# Patient Record
Sex: Female | Born: 2000 | Hispanic: Yes | Marital: Single | Smoking: Never smoker
Health system: Southern US, Community
[De-identification: ages and names within clinical notes are randomized; demographics above are authoritative.]

## PROBLEM LIST (undated history)

## (undated) DIAGNOSIS — Z7289 Other problems related to lifestyle: Secondary | ICD-10-CM

## (undated) DIAGNOSIS — F410 Panic disorder [episodic paroxysmal anxiety] without agoraphobia: Secondary | ICD-10-CM

## (undated) DIAGNOSIS — F419 Anxiety disorder, unspecified: Secondary | ICD-10-CM

## (undated) HISTORY — DX: Anxiety disorder, unspecified: F41.9

## (undated) HISTORY — DX: Other problems related to lifestyle: Z72.89

## (undated) HISTORY — PX: EYE SURGERY: SHX253

## (undated) HISTORY — DX: Panic disorder (episodic paroxysmal anxiety): F41.0

---
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
## 2020-01-03 DIAGNOSIS — H169 Unspecified keratitis: Secondary | ICD-10-CM | POA: Diagnosis not present

## 2020-01-03 DIAGNOSIS — H538 Other visual disturbances: Secondary | ICD-10-CM | POA: Diagnosis not present

## 2020-01-11 ENCOUNTER — Ambulatory Visit (INDEPENDENT_AMBULATORY_CARE_PROVIDER_SITE_OTHER): Payer: Medicaid Other | Admitting: Pediatrics

## 2020-01-11 ENCOUNTER — Encounter: Payer: Self-pay | Admitting: Pediatrics

## 2020-01-11 ENCOUNTER — Telehealth: Payer: Self-pay | Admitting: Pediatrics

## 2020-01-11 ENCOUNTER — Other Ambulatory Visit: Payer: Self-pay

## 2020-01-11 VITALS — Temp 98.5°F | Wt 127.6 lb

## 2020-01-11 DIAGNOSIS — N3000 Acute cystitis without hematuria: Secondary | ICD-10-CM | POA: Diagnosis not present

## 2020-01-11 DIAGNOSIS — H5213 Myopia, bilateral: Secondary | ICD-10-CM | POA: Diagnosis not present

## 2020-01-11 LAB — POCT URINALYSIS DIPSTICK
Bilirubin, UA: NEGATIVE
Glucose, UA: NEGATIVE
Ketones, UA: NEGATIVE
Nitrite, UA: NEGATIVE
Protein, UA: NEGATIVE
Spec Grav, UA: 1.01 (ref 1.010–1.025)
Urobilinogen, UA: NEGATIVE E.U./dL — AB
pH, UA: 8 (ref 5.0–8.0)

## 2020-01-11 MED ORDER — NITROFURANTOIN MONOHYD MACRO 100 MG PO CAPS: 100.0000 mg | ORAL_CAPSULE | Freq: Two times a day (BID) | ORAL | 0 refills | Status: AC

## 2020-01-11 NOTE — Telephone Encounter (Signed)

## 2020-01-11 NOTE — Patient Instructions (Signed)

## 2020-01-11 NOTE — Progress Notes (Signed)
  Subjective:    Kathy Palmer is a 19 y.o. old female here  for Urinary Tract Infection (4 days ago) .    HPI Hurting to pee and different smell for the past 3-4 days.  Pain and lower abdominal pressure today at work.  + urinary frequency and urgency.  No nausea, vomiting, diarrhea, constipation, fever, fatigue, or chills.  No back pain.   No pain with intercourse.  Last intercourse was about 2 weeks ago - 1 female partner.  No new partners.   LMP: last year - has Nexplanon  Review of Systems  History and Problem List: Kathy Palmer has Deliberate self-cutting; Family disruption due to divorce; Anxiety; Birth control counseling; Insomnia; Acne vulgaris; Irregular bleeding; and Abnormal vision screen on their problem list.  Kathy Palmer  has a past medical history of Anxiety, Deliberate self-cutting, and Panic attack (01/09/2015).  Immunizations needed: none     Objective:    Temp 98.5 F (36.9 C) (Temporal)   Wt 127 lb 9.6 oz (57.9 kg)   BMI 26.28 kg/m  Physical Exam Vitals reviewed.  Constitutional:      General: She is not in acute distress.    Appearance: She is not toxic-appearing.  Cardiovascular:     Rate and Rhythm: Normal rate and regular rhythm.     Pulses: Normal pulses.     Heart sounds: Normal heart sounds.  Pulmonary:     Effort: Pulmonary effort is normal.     Breath sounds: Normal breath sounds.  Abdominal:     General: Abdomen is flat. Bowel sounds are normal. There is no distension.     Palpations: Abdomen is soft. There is no mass.     Tenderness: There is no abdominal tenderness. There is no right CVA tenderness or left CVA tenderness.  Genitourinary:    Comments: GU exam deferred Skin:    Capillary Refill: Capillary refill takes less than 2 seconds.  Neurological:     General: No focal deficit present.     Mental Status: She is alert and oriented to person, place, and time.       Assessment and Plan:   Kathy Palmer is a 20 y.o. old female with  Acute cystitis without  hematuria Patient with 4 day history of dysuria, new urinary odor, frequency, and urgency.  U/A with +LE and trace blood consistent with UTI.  Rx for Macrobid.  Culture sent.  Will adjust Rx based on sesitivities if needed.  - POCT urinalysis dipstick - nitrofurantoin, macrocrystal-monohydrate, (MACROBID) 100 MG capsule; Take 1 capsule (100 mg total) by mouth 2 (two) times daily for 5 days.  Dispense: 10 capsule; Refill: 0 - Urine Culture    No follow-ups on file.  Clifton Custard, MD

## 2020-01-11 NOTE — Telephone Encounter (Deleted)
Pre-screening for onsite visit  1. Who is bringing the patient to the visit? ***Pre-screening for onsite visit  1. Who is bringing the patient to the visit? Mom  Informed only one adult can bring patient to the visit to limit possible exposure to COVID19 and facemasks must be worn while in the building by the patient (ages 2 and older) and adult.  2. Has the person bringing the patient or the patient been around anyone with suspected or confirmed COVID-19 in the last 14 days? No   3. Has the person bringing the patient or the patient been around anyone who has been tested for COVID-19 in the last 14 days? No  4. Has the person bringing the patient or the patient had any of these symptoms in the last 14 days? No  Fever (temp 100 F or higher) Breathing problems Cough Sore throat Body aches Chills Vomiting Diarrhea   If all answers are negative, advise patient to call our office prior to your appointment if you or the patient develop any of the symptoms listed above.   If any answers are yes, cancel in-office visit and schedule the patient for a same day telehealth visit with a provider to discuss the next steps.   Informed only one adult can bring patient to the visit to limit possible exposure to COVID19 and facemasks must be worn while in the building by the patient (ages 2 and older) and adult.  2. Has the person bringing the patient or the patient been around anyone with suspected or confirmed COVID-19 in the last 14 days? {yes***/no:17258}   3. Has the person bringing the patient or the patient been around anyone who has been tested for COVID-19 in the last 14 days? {yes***/no:17258}  4. Has the person bringing the patient or the patient had any of these symptoms in the last 14 days? {yes***/no:17258}   Fever (temp 100 F or higher) Breathing problems Cough Sore throat Body aches Chills Vomiting Diarrhea   If all answers are negative, advise patient to call our office  prior to your appointment if you or the patient develop any of the symptoms listed above.   If any answers are yes, cancel in-office visit and schedule the patient for a same day telehealth visit with a provider to discuss the next steps.

## 2020-01-13 LAB — URINE CULTURE
MICRO NUMBER:: 10179283
SPECIMEN QUALITY:: ADEQUATE

## 2020-04-25 ENCOUNTER — Other Ambulatory Visit: Payer: Self-pay

## 2020-04-25 ENCOUNTER — Ambulatory Visit (INDEPENDENT_AMBULATORY_CARE_PROVIDER_SITE_OTHER): Payer: Medicaid Other | Admitting: Pediatrics

## 2020-04-25 ENCOUNTER — Encounter: Payer: Self-pay | Admitting: Pediatrics

## 2020-04-25 ENCOUNTER — Telehealth: Payer: Self-pay | Admitting: Pediatrics

## 2020-04-25 VITALS — Temp 97.8°F | Wt 127.8 lb

## 2020-04-25 DIAGNOSIS — N3000 Acute cystitis without hematuria: Secondary | ICD-10-CM

## 2020-04-25 LAB — POCT URINALYSIS DIPSTICK
Bilirubin, UA: NEGATIVE
Glucose, UA: NEGATIVE
Ketones, UA: NEGATIVE
Nitrite, UA: POSITIVE
Protein, UA: NEGATIVE
Spec Grav, UA: 1.015 (ref 1.010–1.025)
Urobilinogen, UA: 0.2 E.U./dL
pH, UA: 5 (ref 5.0–8.0)

## 2020-04-25 MED ORDER — NITROFURANTOIN MONOHYD MACRO 100 MG PO CAPS: 100.0000 mg | ORAL_CAPSULE | Freq: Two times a day (BID) | ORAL | 0 refills | Status: DC

## 2020-04-25 NOTE — Progress Notes (Signed)
History was provided by the patient.  Kathy Palmer is a 19 y.o. female who is here for urinary tract infection symptoms.     HPI:   - Sx: pain with urination, odorous, cloudy, no urinary incontinence, some normal vaginal discharge, no fevers or body aches, no lower back pain - Sxs started 3 weeks ago. Was treating with OTC UTI pills - which was helping. Sxs came back.  - Here today because still having symptoms - OK appetite - Last sexual intercourse 1 month ago. No pain with sex.  - Birth control: Nexplanon inserted January 2020   Physical Exam:  Temp 97.8 F (36.6 C) (Temporal)   Wt 127 lb 12.8 oz (58 kg)   BMI 26.32 kg/m   Blood pressure percentiles are not available for patients who are 18 years or older.  No LMP recorded. Patient has had an implant.    General:   alert, cooperative and appears stated age     Skin:   normal  Lungs:  clear to auscultation bilaterally  Heart:   regular rate and rhythm, S1, S2 normal, no murmur, click, rub or gallop   Abdomen:  soft, non-tender; bowel sounds normal; no masses,  no organomegaly, no suprapubic tenderness  GU:  not examined  Extremities:   extremities normal, atraumatic, no cyanosis or edema, no CVA tenderness  Neuro:  normal without focal findings    Assessment/Plan: 19 yo patient with history of UTI in February 2021, here today with UTI symptoms. Patient endorses pain with urination, malodor, and cloudy urine. Mild increase in frequency/urgency. No fevers, myalgias, or CVA tenderness so minimal concern for pyelonephritis. Will treat empirically and await urine culture results. Return precautions discussed. Also discussed transition to adult medicine - patient will start to look.   1. Acute cystitis without hematuria - POCT urinalysis dipstick: small leukocytes, positive nitrites - Urine Culture - will follow up  - Follow-up visit as needed.  Ellin Mayhew, MD  04/25/20

## 2020-04-25 NOTE — Patient Instructions (Addendum)
[facility] at [facility] is a great adult clinic to consider [phone]. We are happy to see you here in our clinic while you are finding a adult doctor.    Urinary Tract Infection, Adult A urinary tract infection (UTI) is an infection of any part of the urinary tract. The urinary tract includes:  The kidneys.  The ureters.  The bladder.  The urethra. These organs make, store, and get rid of pee (urine) in the body. What are the causes? This is caused by germs (bacteria) in your genital area. These germs grow and cause swelling (inflammation) of your urinary tract. What increases the risk? You are more likely to develop this condition if:  You have a small, thin tube (catheter) to drain pee.  You cannot control when you pee or poop (incontinence).  You are female, and: ? You use these methods to prevent pregnancy:  A medicine that kills sperm (spermicide).  A device that blocks sperm (diaphragm). ? You have low levels of a female hormone (estrogen). ? You are pregnant.  You have genes that add to your risk.  You are sexually active.  You take antibiotic medicines.  You have trouble peeing because of: ? A prostate that is bigger than normal, if you are female. ? A blockage in the part of your body that drains pee from the bladder (urethra). ? A kidney stone. ? A nerve condition that affects your bladder (neurogenic bladder). ? Not getting enough to drink. ? Not peeing often enough.  You have other conditions, such as: ? Diabetes. ? A weak disease-fighting system (immune system). ? Sickle cell disease. ? Gout. ? Injury of the spine. What are the signs or symptoms? Symptoms of this condition include:  Needing to pee right away (urgently).  Peeing often.  Peeing small amounts often.  Pain or burning when peeing.  Blood in the pee.  Pee that smells bad or not like normal.  Trouble peeing.  Pee that is cloudy.  Fluid coming from the  vagina, if you are female.  Pain in the belly or lower back. Other symptoms include:  Throwing up (vomiting).  No urge to eat.  Feeling mixed up (confused).  Being tired and grouchy (irritable).  A fever.  Watery poop (diarrhea). How is this treated? This condition may be treated with:  Antibiotic medicine.  Other medicines.  Drinking enough water. Follow these instructions at home:  Medicines  Take over-the-counter and prescription medicines only as told by your doctor.  If you were prescribed an antibiotic medicine, take it as told by your doctor. Do not stop taking it even if you start to feel better. General instructions  Make sure you: ? Pee until your bladder is empty. ? Do not hold pee for a long time. ? Empty your bladder after sex. ? Wipe from front to back after pooping if you are a female. Use each tissue one time when you wipe.  Drink enough fluid to keep your pee pale yellow.  Keep all follow-up visits as told by your doctor. This is important. Contact a doctor if:  You do not get better after 1-2 days.  Your symptoms go away and then come back. Get help right away if:  You have very bad back pain.  You have very bad pain in your lower belly.  You have a fever.  You are sick to your stomach (nauseous).  You are throwing up. Summary  A urinary tract infection (UTI) is  an infection of any part of the urinary tract.  This condition is caused by germs in your genital area.  There are many risk factors for a UTI. These include having a small, thin tube to drain pee and not being able to control when you pee or poop.  Treatment includes antibiotic medicines for germs.  Drink enough fluid to keep your pee pale yellow. This information is not intended to replace advice given to you by your health care provider. Make sure you discuss any questions you have with your health care provider. Document Revised: 10/22/2018 Document Reviewed:  05/14/2018 Elsevier Patient Education  2020 [employer].

## 2020-04-25 NOTE — Telephone Encounter (Signed)
Pre-screening for onsite visit  1. Who is bringing the patient to the visit? Self  Informed only one adult can bring patient to the visit to limit possible exposure to COVID19 and facemasks must be worn while in the building by the patient (ages 2 and older) and adult.  2. Has the person bringing the patient or the patient been around anyone with suspected or confirmed COVID-19 in the last 14 days? No   3. Has the person bringing the patient or the patient been around anyone who has been tested for COVID-19 in the last 14 days? No  4. Has the person bringing the patient or the patient had any of these symptoms in the last 14 days? No   Fever (temp 100 F or higher) Breathing problems Cough Sore throat Body aches Chills Vomiting Diarrhea Loss of taste or smell   If all answers are negative, advise patient to call our office prior to your appointment if you or the patient develop any of the symptoms listed above.   If any answers are yes, cancel in-office visit and schedule the patient for a same day telehealth visit with a provider to discuss the next steps.  

## 2020-04-27 LAB — URINE CULTURE
MICRO NUMBER:: 10566097
SPECIMEN QUALITY:: ADEQUATE

## 2020-08-03 ENCOUNTER — Ambulatory Visit (INDEPENDENT_AMBULATORY_CARE_PROVIDER_SITE_OTHER): Payer: Medicaid Other | Admitting: Family

## 2020-08-03 ENCOUNTER — Other Ambulatory Visit: Payer: Self-pay

## 2020-08-03 ENCOUNTER — Encounter: Payer: Self-pay | Admitting: Family

## 2020-08-03 VITALS — BP 103/67 | HR 77 | Ht 58.66 in | Wt 123.4 lb

## 2020-08-03 DIAGNOSIS — L700 Acne vulgaris: Secondary | ICD-10-CM

## 2020-08-03 DIAGNOSIS — Z3046 Encounter for surveillance of implantable subdermal contraceptive: Secondary | ICD-10-CM | POA: Diagnosis not present

## 2020-08-03 MED ORDER — ADAPALENE-BENZOYL PEROXIDE 0.1-2.5 % EX GEL: CUTANEOUS | 0 refills | Status: DC

## 2020-08-03 NOTE — Patient Instructions (Signed)
Follow-up with Itai Barbian in 1 month. Schedule this appointment before you leave clinic today.  Your Nexplanon was removed today and is no longer preventing pregnancy.  If you have sex, remember to use condoms to prevent pregnancy and to prevent sexually transmitted infections.  Leave the outside bandage on for 24 hours.  Leave the smaller bandages on for 3-5 days or until they fall off on their own.  Keep the area clean and dry for 3-5 days.  There is usually bruising or swelling at and around the removal site for a few days to a week after the removal.  If you see redness or pus draining from the removal site, call us immediately.  We would like you to return to the clinic for a follow-up visit in 1 month.  You can call [facility] 24 hours a day with any questions or concerns.  There is always a nurse or doctor available to take your call.  Call 9-1-1 if you have a life-threatening emergency.  For anything else, please call us at [phone] before heading to the ER.  

## 2020-08-03 NOTE — Progress Notes (Signed)
[phone] confidential number

## 2020-08-03 NOTE — Progress Notes (Signed)
History was provided by the patient and mother.  Kathy Palmer is a 19 y.o. female who is here for nexplanon removal and acne.   PCP confirmed? Yes.    Ettefagh, Aron Baba, MD  HPI:   -nexplanon has changed her mood and she feels it has increased her weight  -she does not want to continue method at this time; wants to see how her period will do without it  -acne has gotten worse; she thinks because of wearing masks -affected areas are cheeks; no chest or back acne  Review of Systems  Constitutional: Negative for chills, fever and malaise/fatigue.  HENT: Negative for sore throat.   Eyes: Negative for blurred vision and pain.  Respiratory: Negative for shortness of breath.   Cardiovascular: Negative for chest pain and palpitations.  Gastrointestinal: Negative for abdominal pain, constipation, nausea and vomiting.  Musculoskeletal: Negative for joint pain and myalgias.  Skin: Positive for rash (acne on cheeks).  Neurological: Negative for dizziness and headaches.  Psychiatric/Behavioral: Negative for suicidal ideas. The patient is nervous/anxious.      Patient Active Problem List   Diagnosis Date Noted  . Irregular bleeding 11/25/2019  . Abnormal vision screen 11/25/2019  . Acne vulgaris 06/30/2018  . Nexplanon in place 11/08/2017  . Insomnia 11/08/2017  . Anxiety 01/09/2015  . Family disruption due to divorce 04/20/2013    Current Outpatient Medications on File Prior to Visit  Medication Sig Dispense Refill  . etonogestrel (NEXPLANON) 68 MG IMPL implant 1 each by Subdermal route once.    . AZASITE 1 % ophthalmic solution PONGA UNA GOTA EN OJO IZQUIERDO DOS VECES AL D A SEG N LO INDICADO (Patient not taking: Reported on 08/03/2020)    . Eyelid Cleansers (STERILID) FOAM APPLY A SMALL AMOUNT TO SKIN TWICE A DAY AS DIRECTED SCRUB EYELIDS WITH Q TIPS AND FOAM. (Patient not taking: Reported on 08/03/2020)    . MULTIPLE VITAMIN PO Take by mouth. (Patient not taking:  Reported on 08/03/2020)    . nitrofurantoin, macrocrystal-monohydrate, (MACROBID) 100 MG capsule Take 1 capsule (100 mg total) by mouth 2 (two) times daily. Take one capsule by mouth, twice daily, for 5 days (Patient not taking: Reported on 08/03/2020) 7 capsule 0   No current facility-administered medications on file prior to visit.    No Known Allergies  Physical Exam:    Vitals:   08/03/20 1031  BP: 103/67  Pulse: 77  Weight: 123 lb 6.4 oz (56 kg)  Height: 4' 10.66" (1.49 m)    Blood pressure percentiles are not available for patients who are 18 years or older. No LMP recorded. Patient has had an implant.  Physical Exam Vitals reviewed.  Constitutional:      Appearance: Normal appearance.  HENT:     Head: Normocephalic.     Mouth/Throat:     Mouth: Mucous membranes are moist.     Pharynx: No posterior oropharyngeal erythema.  Eyes:     General: No scleral icterus.    Extraocular Movements: Extraocular movements intact.     Pupils: Pupils are equal, round, and reactive to light.  Cardiovascular:     Rate and Rhythm: Normal rate and regular rhythm.     Heart sounds: No murmur heard.   Pulmonary:     Effort: Pulmonary effort is normal.  Musculoskeletal:        General: Normal range of motion.     Cervical back: Normal range of motion.  Lymphadenopathy:     Cervical: No  cervical adenopathy.  Skin:    General: Skin is warm and dry.     Findings: No rash.  Neurological:     General: No focal deficit present.     Mental Status: She is alert and oriented to person, place, and time.  Psychiatric:        Mood and Affect: Mood normal.      Assessment/Plan: 1. Acne vulgaris -epiduo once daily  2. Nexplanon removal  Risks & benefits of Nexplanon removal discussed. Consent form signed.  The patient denies any allergies to anesthetics or antiseptics.  Procedure: Pt was placed in supine position. left arm was flexed at the elbow and externally rotated so that her  wrist was parallel to her ear, The device was palpated and marked. The site was cleaned with Betadine. The area surrounding the device was covered with a sterile drape. 1% lidocaine was injected just under the device. A scalpel was used to create a small incision. The device was pushed towards the incision. Fibrous tissue surrounding the device was gradually removed from the device. The device was removed and measured to ensure all 4 cm of device was removed. Steri-strips were used to close the incision. Pressure dressing was applied to the patient.  The patient was instructed to removed the pressure dressing in 24 hrs.  The patient was advised to move slowly from a supine to an upright position  The patient denied any concerns or complaints  The patient was instructed to schedule a follow-up appt in 1 month. The patient will be called in 1 week to address any concerns.

## 2020-08-05 ENCOUNTER — Encounter: Payer: Self-pay | Admitting: Family

## 2020-08-31 ENCOUNTER — Ambulatory Visit (INDEPENDENT_AMBULATORY_CARE_PROVIDER_SITE_OTHER): Payer: Medicaid Other | Admitting: Family

## 2020-08-31 ENCOUNTER — Encounter: Payer: Self-pay | Admitting: Family

## 2020-08-31 VITALS — BP 106/73 | HR 81 | Ht 58.66 in | Wt 125.4 lb

## 2020-08-31 DIAGNOSIS — N926 Irregular menstruation, unspecified: Secondary | ICD-10-CM | POA: Diagnosis not present

## 2020-08-31 DIAGNOSIS — R82998 Other abnormal findings in urine: Secondary | ICD-10-CM | POA: Diagnosis not present

## 2020-08-31 DIAGNOSIS — R8290 Unspecified abnormal findings in urine: Secondary | ICD-10-CM | POA: Diagnosis not present

## 2020-08-31 DIAGNOSIS — Z3202 Encounter for pregnancy test, result negative: Secondary | ICD-10-CM | POA: Diagnosis not present

## 2020-08-31 DIAGNOSIS — N3000 Acute cystitis without hematuria: Secondary | ICD-10-CM | POA: Diagnosis not present

## 2020-08-31 DIAGNOSIS — Z7251 High risk heterosexual behavior: Secondary | ICD-10-CM | POA: Diagnosis not present

## 2020-08-31 LAB — POCT URINALYSIS DIPSTICK
Bilirubin, UA: NEGATIVE
Blood, UA: NEGATIVE
Glucose, UA: NEGATIVE
Ketones, UA: NEGATIVE
Nitrite, UA: POSITIVE
Protein, UA: NEGATIVE
Spec Grav, UA: 1.015 (ref 1.010–1.025)
Urobilinogen, UA: NEGATIVE E.U./dL — AB
pH, UA: 5 (ref 5.0–8.0)

## 2020-08-31 LAB — HCG, QUANTITATIVE, PREGNANCY: HCG, Total, QN: <3 m[IU]/mL

## 2020-08-31 LAB — POCT URINE PREGNANCY: Preg Test, Ur: NEGATIVE

## 2020-08-31 NOTE — Progress Notes (Signed)
History was provided by the patient and mother.  Kathy Palmer is a 19 y.o. female who is here for follow-up after nexplanon removal and epi-duo for acne.   PCP confirmed? Yes.    Ettefagh, Aron Baba, MD  HPI:    -nexplanon removal site: no concerns -had one incident of unprotected sex after removal and before her LMP -LMP was last Friday lasting for 3 days; her cycle length prior to nexplanon was 5 days. Had some cramping before.  -interested in pills -some dysuria a few days ago but drinks a lot of water, went away; foul odor not vaginal discharge, no lesions, no cramping now, no pain with intercourse -no flank pain, no fever, no n/v   Review of Systems  Constitutional: Negative for chills, fever and malaise/fatigue.  HENT: Negative for sore throat.   Eyes: Negative for blurred vision and pain.  Respiratory: Negative for shortness of breath.   Cardiovascular: Negative for chest pain and palpitations.  Gastrointestinal: Negative for abdominal pain, nausea and vomiting.  Genitourinary: Positive for dysuria. Negative for flank pain and hematuria.  Musculoskeletal: Negative for myalgias.  Skin: Negative for rash.  Neurological: Negative for dizziness and headaches.  Psychiatric/Behavioral: The patient is not nervous/anxious.     Patient Active Problem List   Diagnosis Date Noted  . Irregular bleeding 11/25/2019  . Abnormal vision screen 11/25/2019  . Acne vulgaris 06/30/2018  . Nexplanon in place 11/08/2017  . Insomnia 11/08/2017  . Anxiety 01/09/2015  . Family disruption due to divorce 04/20/2013    Current Outpatient Medications on File Prior to Visit  Medication Sig Dispense Refill  . Adapalene-Benzoyl Peroxide 0.1-2.5 % gel Apply to affected areas once at night. (Patient not taking: Reported on 08/31/2020) 45 g 0  . AZASITE 1 % ophthalmic solution PONGA UNA GOTA EN OJO IZQUIERDO DOS VECES AL D A SEG N LO INDICADO (Patient not taking: Reported on 08/03/2020)     . etonogestrel (NEXPLANON) 68 MG IMPL implant 1 each by Subdermal route once. (Patient not taking: Reported on 08/31/2020)    . Eyelid Cleansers (STERILID) FOAM APPLY A SMALL AMOUNT TO SKIN TWICE A DAY AS DIRECTED SCRUB EYELIDS WITH Q TIPS AND FOAM. (Patient not taking: Reported on 08/03/2020)    . MULTIPLE VITAMIN PO Take by mouth. (Patient not taking: Reported on 08/03/2020)    . nitrofurantoin, macrocrystal-monohydrate, (MACROBID) 100 MG capsule Take 1 capsule (100 mg total) by mouth 2 (two) times daily. Take one capsule by mouth, twice daily, for 5 days (Patient not taking: Reported on 08/03/2020) 7 capsule 0   No current facility-administered medications on file prior to visit.    No Known Allergies  Physical Exam:    Vitals:   08/31/20 0942  BP: 106/73  Pulse: 81  Weight: 125 lb 6.4 oz (56.9 kg)  Height: 4' 10.66" (1.49 m)   Wt Readings from Last 3 Encounters:  08/31/20 125 lb 6.4 oz (56.9 kg) (46 %, Z= -0.09)*  08/03/20 123 lb 6.4 oz (56 kg) (43 %, Z= -0.19)*  04/25/20 127 lb 12.8 oz (58 kg) (52 %, Z= 0.06)*   * Growth percentiles are based on CDC (Girls, 2-20 Years) data.    Blood pressure percentiles are not available for patients who are 18 years or older. Patient's last menstrual period was 08/25/2020.  Physical Exam Vitals reviewed. Exam conducted with a chaperone present.  Constitutional:      Appearance: Normal appearance.  HENT:     Head: Normocephalic.  Mouth/Throat:     Pharynx: Oropharynx is clear.  Eyes:     General: No scleral icterus.    Extraocular Movements: Extraocular movements intact.     Pupils: Pupils are equal, round, and reactive to light.  Cardiovascular:     Rate and Rhythm: Normal rate and regular rhythm.     Heart sounds: No murmur heard.   Pulmonary:     Effort: Pulmonary effort is normal.  Abdominal:     General: Abdomen is flat. There is no distension.     Palpations: Abdomen is soft.  Musculoskeletal:        General: No  swelling. Normal range of motion.     Cervical back: Normal range of motion.  Lymphadenopathy:     Cervical: No cervical adenopathy.  Skin:    General: Skin is warm and dry.     Findings: No rash.  Neurological:     General: No focal deficit present.     Mental Status: She is alert and oriented to person, place, and time.  Psychiatric:        Mood and Affect: Mood normal.      Assessment/Plan:  19 yo A/I female presents for follow-up s/p nexplanon removal; acutely today her urine reveals UTI, positive for nitrites, negative for blood. Of note she has had positive nitrites in urine x 3 within 2 years. She is outside of window for [facility], however due to irregular bleeding pattern with last menstrual cycle and unprotected intercourse, cannot be reasonably certain of pregnancy status today. Will obtain B-HCG quant while culture is obtained and treat accordingly. Discussed birth control and emergency contraception.   1. Irregular bleeding 2. Acute cystitis without hematuria 3. Foul smelling urine - POCT urinalysis dipstick 4. Other abnormal findings in urine - Urine Culture 5. Unprotected sexual intercourse - B-HCG Quant 6. Pregnancy examination or test, negative result - POCT urine pregnancy

## 2020-08-31 NOTE — Patient Instructions (Signed)
I will send in medicine for your UTI after we get the blood work back today. Until then, drink plenty of fluids and empty your bladder frequently.   Let me know if you decide to try another form of birth control. Until then, use condoms and remember that if needed we have emergency contraception available for you here or by prescription.

## 2020-09-02 LAB — URINE CULTURE
MICRO NUMBER:: 11072151
SPECIMEN QUALITY:: ADEQUATE

## 2020-09-04 ENCOUNTER — Other Ambulatory Visit: Payer: Self-pay | Admitting: Family

## 2020-09-04 DIAGNOSIS — N3000 Acute cystitis without hematuria: Secondary | ICD-10-CM

## 2020-09-04 MED ORDER — NITROFURANTOIN MONOHYD MACRO 100 MG PO CAPS: 100.0000 mg | ORAL_CAPSULE | Freq: Two times a day (BID) | ORAL | 0 refills | Status: DC

## 2020-09-15 ENCOUNTER — Ambulatory Visit (INDEPENDENT_AMBULATORY_CARE_PROVIDER_SITE_OTHER): Payer: Medicaid Other | Admitting: Pediatrics

## 2020-09-15 ENCOUNTER — Other Ambulatory Visit: Payer: Self-pay

## 2020-09-15 ENCOUNTER — Encounter: Payer: Self-pay | Admitting: Pediatrics

## 2020-09-15 VITALS — Temp 98.3°F | Wt 126.6 lb

## 2020-09-15 DIAGNOSIS — H00014 Hordeolum externum left upper eyelid: Secondary | ICD-10-CM | POA: Diagnosis not present

## 2020-09-15 MED ORDER — MOXIFLOXACIN HCL 0.5 % OP SOLN: 1.0000 [drp] | Freq: Three times a day (TID) | OPHTHALMIC | 0 refills | Status: AC

## 2020-09-15 NOTE — Progress Notes (Signed)
   Subjective:     Kathy Palmer, is a 19 y.o. female  HPI  Chief Complaint  Patient presents with  . 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     Objective:     Temp 98.3 F (36.8 C) (Temporal)   Wt 126 lb 9.6 oz (57.4 kg)   LMP 08/25/2020   BMI 25.87 kg/m   Physical Exam General: well-appearing   Head: normocephalic Eyes:  right eye: clear sclera, no drainage, no papule, no edema left eye: sclera mild injection, one papule of upper lid under lashes but external, mild edema of upper lid but no periorbital erythema Nose: nares patent, no congestion Mouth: moist mucous membranes, mild post OP erythema Resp: normal work, clear to auscultation BL CV: regular rate, normal S1/2, no murmur, 2+ distal pulses Ab: soft, non-distended  Neuro: awake, alert, EOMI, PERRL    Assessment & Plan:   19 yo F w/ h/o recurrent stye  1. Hordeolum externum of left upper eyelid - Stye  of L upper lid concerning for infected stye, will proceeded with empiric treatment given history of recurrent stye requiring antibiotic treatment  - Today not consistent with preseptal/periorbital cellulitis or optic cellulitis  - Recommended follow-up with Ophtho as before - Strict return precautions - moxifloxacin (VIGAMOX) 0.5 % ophthalmic solution; Place 1 drop into the left eye 3 (three) times daily for 7 days.  Dispense: 1.1 mL; Refill: 0  Supportive care and return precautions reviewed.   Scharlene Gloss, MD

## 2020-09-15 NOTE — Patient Instructions (Signed)
Please make an appointment to see your eye doctor soon. Call us if you need a referral.   Take antibiotic eye drops for 7 days. Wash your hands well.  Stye  A stye, also known as a hordeolum, is a bump that forms on an eyelid. It may look like a pimple next to the eyelash. A stye can form inside the eyelid (internal stye) or outside the eyelid (external stye). A stye can cause redness, swelling, and pain on the eyelid. Styes are very common. Anyone can get them at any age. They usually occur in just one eye, but you may have more than one in either eye. What are the causes? A stye is caused by an infection. The infection is almost always caused by bacteria called Staphylococcus aureus. This is a common type of bacteria that lives on the skin. An internal stye may result from an infected oil-producing gland inside the eyelid. An external stye may be caused by an infection at the base of the eyelash (hair follicle). What increases the risk? You are more likely to develop a stye if:  You have had a stye before.  You have any of these conditions: ? Diabetes. ? Red, itchy, inflamed eyelids (blepharitis). ? A skin condition such as seborrheic dermatitis or rosacea. ? High fat levels in your blood (lipids). What are the signs or symptoms? The most common symptom of a stye is eyelid pain. Internal styes are more painful than external styes. Other symptoms may include:  Painful swelling of your eyelid.  A scratchy feeling in your eye.  Tearing and redness of your eye.  Pus draining from the stye. How is this diagnosed? Your health care provider may be able to diagnose a stye just by examining your eye. The health care provider may also check to make sure:  You do not have a fever or other signs of a more serious infection.  The infection has not spread to other parts of your eye or areas around your eye. How is this treated? Most styes will clear up in a few days without treatment or  with warm compresses applied to the area. You may need to use antibiotic drops or ointment to treat an infection. In some cases, if your stye does not heal with routine treatment, your health care provider may drain pus from the stye using a thin blade or needle. This may be done if the stye is large, causing a lot of pain, or affecting your vision. Follow these instructions at home:  Take over-the-counter and prescription medicines only as told by your health care provider. This includes eye drops or ointments.  If you were prescribed an antibiotic medicine, apply or use it as told by your health care provider. Do not stop using the antibiotic even if your condition improves.  Apply a warm, wet cloth (warm compress) to your eye for 5-10 minutes, 4 times a day.  Clean the affected eyelid as directed by your health care provider.  Do not wear contact lenses or eye makeup until your stye has healed.  Do not try to pop or drain the stye.  Do not rub your eye. Contact a health care provider if:  You have chills or a fever.  Your stye does not go away after several days.  Your stye affects your vision.  Your eyeball becomes swollen, red, or painful. Get help right away if:  You have pain when moving your eye around. Summary  A stye is a  bump that forms on an eyelid. It may look like a pimple next to the eyelash.  A stye can form inside the eyelid (internal stye) or outside the eyelid (external stye). A stye can cause redness, swelling, and pain on the eyelid.  Your health care provider may be able to diagnose a stye just by examining your eye.  Apply a warm, wet cloth (warm compress) to your eye for 5-10 minutes, 4 times a day. This information is not intended to replace advice given to you by your health care provider. Make sure you discuss any questions you have with your health care provider. Document Revised: 10/17/2017 Document Reviewed: 07/17/2017 Elsevier Patient Education   2020 [employer].

## 2021-01-04 DIAGNOSIS — H169 Unspecified keratitis: Secondary | ICD-10-CM | POA: Diagnosis not present

## 2021-01-04 DIAGNOSIS — H538 Other visual disturbances: Secondary | ICD-10-CM | POA: Diagnosis not present

## 2021-01-04 DIAGNOSIS — H00026 Hordeolum internum left eye, unspecified eyelid: Secondary | ICD-10-CM | POA: Diagnosis not present

## 2021-01-30 ENCOUNTER — Ambulatory Visit (INDEPENDENT_AMBULATORY_CARE_PROVIDER_SITE_OTHER): Payer: Medicaid Other | Admitting: Pediatrics

## 2021-01-30 ENCOUNTER — Other Ambulatory Visit: Payer: Self-pay

## 2021-01-30 VITALS — Temp 98.5°F | Wt 130.0 lb

## 2021-01-30 DIAGNOSIS — R109 Unspecified abdominal pain: Secondary | ICD-10-CM | POA: Diagnosis not present

## 2021-01-30 DIAGNOSIS — R300 Dysuria: Secondary | ICD-10-CM

## 2021-01-30 DIAGNOSIS — Z3202 Encounter for pregnancy test, result negative: Secondary | ICD-10-CM | POA: Diagnosis not present

## 2021-01-30 DIAGNOSIS — R110 Nausea: Secondary | ICD-10-CM

## 2021-01-30 DIAGNOSIS — N3000 Acute cystitis without hematuria: Secondary | ICD-10-CM

## 2021-01-30 DIAGNOSIS — N3001 Acute cystitis with hematuria: Secondary | ICD-10-CM

## 2021-01-30 LAB — POCT URINALYSIS DIPSTICK
Bilirubin, UA: NEGATIVE
Blood, UA: POSITIVE
Glucose, UA: NEGATIVE
Ketones, UA: NEGATIVE
Nitrite, UA: POSITIVE
Protein, UA: POSITIVE — AB
Spec Grav, UA: 1.01 (ref 1.010–1.025)
Urobilinogen, UA: 0.2 E.U./dL
pH, UA: 6.5 (ref 5.0–8.0)

## 2021-01-30 LAB — POCT URINE PREGNANCY: Preg Test, Ur: NEGATIVE

## 2021-01-30 MED ORDER — NITROFURANTOIN MONOHYD MACRO 100 MG PO CAPS: 100.0000 mg | ORAL_CAPSULE | Freq: Two times a day (BID) | ORAL | 0 refills | Status: AC

## 2021-01-30 NOTE — Progress Notes (Signed)
PCP: Clifton Custard, MD   Chief Complaint  Patient presents with  . Abdominal Pain    Started 1 day ago sharp pain with nausea. Drank some warm tea and helped some.  . Nausea      Subjective:  HPI:  Kathy Palmer is a 20 y.o. female here with sharp abdominal pain with nausea   Patient personal number: [id number].  Prefers results sent via MyChart.    Chart review - E Coli UTI in Oct 2021.  Pan-sensitive.  - Birth control: Nexplanon inserted January 2020.  Removed.    Abdominal pain  -Periumbilical and suprapubic pain over last 1-2 days.  Does not wax/wane.  Sometimes lying down helps.  Associated nausea but no vomiting.  No fever.  -Dysuria started on Sunday, 3/13.  Persistent.  -Soft stool on Saturday, but none since.  Consistent with baseline.  -No abnormal vaginal discharge or odor. No heavy bleeding.  -LMP on 3/5.  Ended on 3/9.  Not heavy.  Last sexual encounter was yesterday. -Not using any form of contraception.  Recently counseled in adolescent clinic.  Does not desire pregnancy.    Meds: Current Outpatient Medications  Medication Sig Dispense Refill  . nitrofurantoin, macrocrystal-monohydrate, (MACROBID) 100 MG capsule Take 1 capsule (100 mg total) by mouth 2 (two) times daily for 5 days. 10 capsule 0   No current facility-administered medications for this visit.    ALLERGIES: No Known Allergies  PMH:  Past Medical History:  Diagnosis Date  . Anxiety   . Deliberate self-cutting   . 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 Panic attack 01/09/2015    PSH:  Past Surgical History:  Procedure Laterality Date  . EYE SURGERY      Social history:  Social History   Social History Narrative   Had a very difficult year because parents are divorced.  Maternal GF also passed away this past year.      Family history: Family History  Problem Relation Age of Onset  . Healthy Mother      Objective:   Physical Examination:  Temp: 98.5 F (36.9  C) (Oral) Wt: 130 lb (59 kg)  GENERAL: Well appearing, no distress,  Interactive  HEENT: NCAT, clear sclerae, no nasal discharge, no tonsillary erythema or exudate, MMM, no oral lesions  NECK: Supple, no cervical LAD LUNGS: EWOB, CTAB, no wheeze, no crackles CARDIO: RRR, normal S1S2 no murmur, well perfused ABDOMEN: Normoactive bowel sounds, soft, mild tenderness just below umbilicus and suprapubic region, no masses or organomegaly  Assessment/Plan:   Chrissi is a 20 y.o. old female likely with UTI.  Differential includes STI (urine GC/CT pending), constipation, IBS.  Low concern for pyelonephritis, appendicitis or ovarian torsion.  Urine pregnancy normal. Will treat empirically with antibiotic while awaiting culture results.    Acute cystitis without hematuria Recent history of E coli UTI with similar symptoms today.  - Urine labs  -     POCT urinalysis dipstick  - pos protein, 2+ leuks, pos nitrite, trace blood -     Urine Culture pending -     C. trachomatis/N. gonorrhoeae RNA pending -     POCT urine pregnancy negative  - Start  nitrofurantoin, macrocrystal-monohydrate, (MACROBID) 100 MG capsule; Take 1 capsule (100 mg total) by mouth 2 (two) times daily for 5 days. - Strict return precautions reviewed, including new high fever, vomiting, sig increase in pain  Follow up: Return in about 2 months (around 04/01/2021) for well visit with PCP.  Will  send MyChart message with results per patient preference.  Phone contact in HPI.  Enis Gash, MD  [facility]

## 2021-01-30 NOTE — Patient Instructions (Addendum)
Start taking the antibiotic two times per day.  I will call with urine culture results.  If positive, we will continue the antibiotic for a total of five days.   If worsening abdominal pain, vomiting, or new, high fever, please seek medical care.    As your medical provider, it is important to me that you continue to receive high-quality primary care services as you transition to adulthood.  After the age of 52, you can no longer be seen at the [facility] for your primary care health services.   Below is a list of adult medicine practices that are currently accepting new patients.  Please reach out to one of these practices to schedule a new patient appointment as soon as possible.  Please be aware that you will not be able to be seen at our office after your 22nd birthday.   Adult Primary Care Clinics Name Criteria Services   [facility]  Address: [address] [place], [place] [address]  Phone: [phone] Hours: Monday - Friday 9 AM -6 PM  Types of insurance accepted:  Marland Kitchen Nurse, learning disability . [facility] (orange card) . Medicaid . Medicare . Uninsured  Language services:  Marland Kitchen Video and phone interpreters available   Ages 58 and older    . Adult primary care . Onsite pharmacy . Integrated behavioral health . Financial assistance counseling . Walk-in hours for established patients  Financial assistance counseling hours: Tuesdays 2:00PM - 5:00PM  Thursday 8:30AM - 4:30PM  Space is limited, 10 on Tuesday and 20 on Thursday on a first come, first serve basis  Name Criteria Services   [facility]  Address: [address] [place], [place] [address]  Phone: [phone]  Hours: Monday - Friday 8:30 AM - 5 PM  Types of insurance accepted:  Marland Kitchen Nurse, learning disability . Medicaid . Medicare . Uninsured  Language services:  Marland Kitchen Video and phone  interpreters available   All ages - newborn to adult   . Primary care for all ages (children and adults) . Integrated behavioral health . Nutritionist . Financial assistance counseling   Name Criteria Services   [facility]  Located on the ground floor of [facility]  Address: [address]  [place],  [place]  [address]  Phone: [phone]  Hours: Monday - Friday 8:15 AM - 5 PM  Types of insurance accepted:  Marland Kitchen Nurse, learning disability . Medicaid . Medicare . Uninsured  Language services:  Marland Kitchen Video and phone interpreters available   Ages 64 and older   . Adult primary care . Nutritionist . Certified Diabetes Educator  . Integrated behavioral health . Financial assistance counseling   Name Criteria Services   [facility]  Address: [address] [place], [place] [address]  Phone: [phone]  Hours: Monday - Friday 8:30 AM - 5 PM    Types of insurance accepted:  Marland Kitchen Nurse, learning disability . Medicaid . Medicare . Uninsured  Language services:  Marland Kitchen Video and phone interpreters available   All ages - newborn to adult   . Primary care for all ages (children and adults) . Integrated behavioral health . Financial assistance counseling

## 2021-01-31 LAB — C. TRACHOMATIS/N. GONORRHOEAE RNA
C. trachomatis RNA, TMA: NOT DETECTED
N. gonorrhoeae RNA, TMA: NOT DETECTED

## 2021-02-01 LAB — URINE CULTURE
MICRO NUMBER:: 11648987
SPECIMEN QUALITY:: ADEQUATE

## 2021-03-07 ENCOUNTER — Other Ambulatory Visit (HOSPITAL_COMMUNITY)
Admission: RE | Admit: 2021-03-07 | Discharge: 2021-03-07 | Disposition: A | Discharge status: Home / Self Care | Payer: Medicaid Other | Source: Ambulatory Visit | Attending: Pediatrics | Admitting: Pediatrics

## 2021-03-07 ENCOUNTER — Other Ambulatory Visit: Payer: Self-pay

## 2021-03-07 ENCOUNTER — Ambulatory Visit (INDEPENDENT_AMBULATORY_CARE_PROVIDER_SITE_OTHER): Payer: Medicaid Other | Admitting: Pediatrics

## 2021-03-07 ENCOUNTER — Encounter: Payer: Self-pay | Admitting: Pediatrics

## 2021-03-07 VITALS — BP 119/74 | HR 110 | Ht 59.02 in | Wt 132.0 lb

## 2021-03-07 DIAGNOSIS — Z7251 High risk heterosexual behavior: Secondary | ICD-10-CM

## 2021-03-07 DIAGNOSIS — Z3202 Encounter for pregnancy test, result negative: Secondary | ICD-10-CM | POA: Diagnosis not present

## 2021-03-07 DIAGNOSIS — Z30011 Encounter for initial prescription of contraceptive pills: Secondary | ICD-10-CM

## 2021-03-07 DIAGNOSIS — Z113 Encounter for screening for infections with a predominantly sexual mode of transmission: Secondary | ICD-10-CM | POA: Diagnosis not present

## 2021-03-07 LAB — POCT URINE PREGNANCY: Preg Test, Ur: NEGATIVE

## 2021-03-07 MED ORDER — LEVONORGESTREL-ETHINYL ESTRAD 0.1-20 MG-MCG PO TABS: 1.0000 | ORAL_TABLET | Freq: Every day | ORAL | 3 refills | Status: DC

## 2021-03-07 MED ORDER — ULIPRISTAL ACETATE 30 MG PO TABS
30.0000 mg | ORAL_TABLET | Freq: Once | ORAL | Status: AC
  Administered 2021-03-07: 30 mg via ORAL

## 2021-03-07 NOTE — Patient Instructions (Addendum)
Emergency contraception today. Please use condoms every time between now and your next period.  When your next period starts, start taking birth control pill daily on that day.    Emergency Contraception Emergency contraception refers to birth control methods that prevent pregnancy after unprotected sex. Emergency contraception may be recommended:  When a condom breaks.  After a sexual assault.  If you forgot to take your birth control pill.  If you used inadequate contraception during sex. Emergency contraception is most effective if used as soon as possible after sex. It is important to note that it does not protect against STIs (sexually transmitted infections). Do not use emergency contraception as your only form of birth control. Types of emergency contraceptives Emergency contraception must be used as soon as possible after having unprotected sex and within 5 days after the sex. The sooner you use emergency contraception after unprotected sex, the more effective it is. The following types of emergency contraception are available:  Hormonal pills that work by preventing the ovaries from releasing an egg (ovulation) and preventing fertilization of an egg. There are two types of hormonal pills: ? A pill that contains high doses of estrogen and progesterone. ? A pill that contains only progesterone. This may be a single pill or two pills taken 12-24 hours apart.  A nonhormonal pill that works by preventing progesterone from having its normal effect on ovulation and the lining of the uterus (endometrium). A prescription for this medicine is usually required.  A nonhormonal medical device that is inserted into the uterus (copper intrauterine device, IUD). The copper in the IUD causes the sperm to be less able to fertilize the egg. A health care provider must insert the IUD. Most types of emergency contraceptive pills are available without a prescription or a visit with your health care  provider. If you are younger than 34, you may need a prescription. Talk with your pharmacist about your options.   Side effects Ask your health care provider about the possible side effects of emergency contraceptives. Side effects may include:  Abdominal pain and cramps.  Nausea and vomiting.  Breast tenderness.  Headache.  Dizziness.  Tiredness (fatigue).  Irregular bleeding or spotting. If you take emergency contraceptive pills while you are pregnant, it will not end your pregnancy or harm your baby. Follow these instructions at home:  Take over-the-counter and prescription medicines only as told by your health care provider.  Eat something before taking the emergency contraception pills to help prevent nausea.  If you feel tired or dizzy, rest until you feel better.  If you used a hormonal emergency contraceptive pill, continue using your normal method of birth control. Be sure to use a barrier method (such as condoms) for contraception for at least 7 days.  If you used the nonhormonal emergency contraceptive pill, do not go back to your normal hormonal contraception pills for at least 5 days after taking the emergency contraceptive. Be sure to use a barrier method (such as condoms) for contraception until the next menstrual period. Contact a health care provider if:  You vomit within 3 hours after taking a pill. You may need to take another pill.  You have a severe headache.  You have vaginal bleeding that does not stop.  It has been 21 days since you took an emergency contraception pill and you have not had a menstrual period. Get help right away if:  You have chest pain.  You have leg pain.  You have numbness or  weakness of your arms or legs.  You have slurred speech.  You have visual problems. These symptoms may represent a serious problem that is an emergency. Do not wait to see if the symptoms will go away. Get medical help right away. Call your local  emergency services (911 in the [place] Do not drive yourself to the hospital. Summary  Emergency contraceptives prevent pregnancy after unprotected sex.  Emergency contraception will not work if you are already pregnant and will not harm the baby if you are pregnant.  Some emergency contraceptives are available from your local pharmacy without a prescription.  Talk to your health care provider about the type of emergency contraceptives that are best for you. This information is not intended to replace advice given to you by your health care provider. Make sure you discuss any questions you have with your health care provider. Document Revised: 05/17/2020 Document Reviewed: 05/17/2020 Elsevier Patient Education  2021 [employer].

## 2021-03-07 NOTE — Progress Notes (Signed)
History was provided by the patient.  Kathy Palmer is a 20 y.o. female who is here for emergency contraception.  Ettefagh, Aron Baba, MD   HPI:  Pt reports that she is back in for emergency contraception. Last sexually active on Sunday. This is her regular partner. Using condoms sometimes. Thinking about starting the pill. Worried about IUD. Didn't like nexplanon. Cycles. Are usually about 4 days. They are regular and come monthly. First few days has some cramping and heavy flow.   Patient's last menstrual period was 02/19/2021.    Patient Active Problem List   Diagnosis Date Noted  . Irregular bleeding 11/25/2019  . Abnormal vision screen 11/25/2019  . Acne vulgaris 06/30/2018  . Nexplanon in place 11/08/2017  . Insomnia 11/08/2017  . Anxiety 01/09/2015  . Family disruption due to divorce 04/20/2013    No current outpatient medications on file prior to visit.   No current facility-administered medications on file prior to visit.    No Known Allergies   Physical Exam:    Vitals:   03/07/21 1526  BP: 119/74  Pulse: (!) 110  Weight: 132 lb (59.9 kg)  Height: 4' 11.02" (1.499 m)    Blood pressure percentiles are not available for patients who are 18 years or older.  Physical Exam Vitals and nursing note reviewed.  Constitutional:      General: She is not in acute distress.    Appearance: She is well-developed.  Neck:     Thyroid: No thyromegaly.  Cardiovascular:     Rate and Rhythm: Normal rate and regular rhythm.     Heart sounds: No murmur heard.   Pulmonary:     Breath sounds: Normal breath sounds.  Abdominal:     Palpations: Abdomen is soft. There is no mass.     Tenderness: There is no abdominal tenderness. There is no guarding.  Musculoskeletal:     Right lower leg: No edema.     Left lower leg: No edema.  Lymphadenopathy:     Cervical: No cervical adenopathy.  Skin:    General: Skin is warm.     Findings: No rash.  Neurological:      Mental Status: She is alert.     Comments: No tremor     Assessment/Plan: 1. Unprotected sexual intercourse Given that sexual activity was >72 hours ago, it is best to use ulipristal acetate vs. Levonorgestrel. Given today in clinic.  - ulipristal acetate (ELLA) tablet 30 mg  2. OCP (oral contraceptive pills) initiation Discussed starting OCP on first day of menstrual cycle given need to give ella today vs. Levonorgestrel. If she does not have her period on time, advised to take urine preg. At home. Reviewed all contraception methods with pt.  - levonorgestrel-ethinyl estradiol (AVIANE) 0.1-20 MG-MCG tablet; Take 1 tablet by mouth daily.  Dispense: 84 tablet; Refill: 3  Return PRN.   Alfonso Ramus, FNP

## 2021-03-08 LAB — URINE CYTOLOGY ANCILLARY ONLY
Chlamydia: NEGATIVE
Comment: NEGATIVE
Comment: NORMAL
Neisseria Gonorrhea: NEGATIVE

## 2021-04-03 ENCOUNTER — Encounter: Payer: Self-pay | Admitting: Pediatrics

## 2021-04-03 ENCOUNTER — Ambulatory Visit (INDEPENDENT_AMBULATORY_CARE_PROVIDER_SITE_OTHER): Payer: Medicaid Other | Admitting: Pediatrics

## 2021-04-03 VITALS — BP 116/70 | HR 94 | Ht 58.94 in | Wt 130.5 lb

## 2021-04-03 DIAGNOSIS — Z13220 Encounter for screening for lipoid disorders: Secondary | ICD-10-CM

## 2021-04-03 DIAGNOSIS — Z23 Encounter for immunization: Secondary | ICD-10-CM | POA: Diagnosis not present

## 2021-04-03 DIAGNOSIS — Z113 Encounter for screening for infections with a predominantly sexual mode of transmission: Secondary | ICD-10-CM

## 2021-04-03 DIAGNOSIS — Z0000 Encounter for general adult medical examination without abnormal findings: Secondary | ICD-10-CM | POA: Diagnosis not present

## 2021-04-03 LAB — POCT RAPID HIV: Rapid HIV, POC: NEGATIVE

## 2021-04-03 NOTE — Patient Instructions (Signed)
Adult Primary Care Clinics Name Criteria Services   [facility]  Address: [address] [place], [place] [address]  Phone: [phone] Hours: Monday - Friday 9 AM -6 PM  Types of insurance accepted:  Commercial insurance [facility] (orange card) Medicaid Medicare Uninsured  Language services:  Video and phone interpreters available   Ages 18 and older    Adult primary care Onsite pharmacy Integrated behavioral health Financial assistance counseling Walk-in hours for established patients  Financial assistance counseling hours: Tuesdays 2:00PM - 5:00PM  Thursday 8:30AM - 4:30PM  Space is limited, 10 on Tuesday and 20 on Thursday on a first come, first serve basis  Name Criteria Services   [facility]  Address: [address] [place], [place] [address]  Phone: [phone]  Hours: Monday - Friday 8:30 AM - 5 PM  Types of insurance accepted:  Commercial insurance Medicaid Medicare Uninsured  Language services:  Video and phone interpreters available   All ages - newborn to adult   Primary care for all ages (children and adults) Integrated behavioral health Nutritionist Financial assistance counseling   Name Criteria Services   [facility]  Located on the ground floor of [facility]  Address: [address]  [place],  [place]  [address]  Phone: [phone]  Hours: Monday - Friday 8:15 AM - 5 PM  Types of insurance accepted:  Commercial insurance Medicaid Medicare Uninsured  Language services:  Video and phone interpreters available   Ages 18 and older   Adult primary care Nutritionist Certified Diabetes Educator  Integrated behavioral health Financial assistance counseling   Name Criteria Services   [facility]  Address: [address] [place], [place] [address]  Phone:  [phone]  Hours: Monday - Friday 8:30 AM - 5 PM    Types of insurance accepted:  Commercial insurance Medicaid Medicare Uninsured  Language services:  Video and phone interpreters available   All ages - newborn to adult   Primary care for all ages (children and adults) Integrated behavioral health Financial assistance counseling    

## 2021-04-03 NOTE — Progress Notes (Signed)
Adolescent Well Care Visit Kathy Palmer is a 20 y.o. female who is here for well care.    PCP:  Clifton Custard, MD   History was provided by the patient.   Current Issues: Current concerns include used emergency contraception last month.  Got Rx for OCPs but didn't start them because she was worried about weight gain and acne.  She reports that she does not want to get pregnant at this time.  Nutrition: Nutrition/Eating Behaviors: doesn't eat many fruits/veggies Adequate calcium in diet?: yes Supplements/ Vitamins: no  Exercise/ Media: Play any Sports?/ Exercise: gym with boyfriend about once a week  Sleep:  Sleep: difficulty sleeping, doesn't feel tired at bedtim  Social Screening: Lives with:  Mom, half sister and younger brother Parental relations:  good Activities, Work, and Regulatory affairs officer?: looking for a new job Concerns regarding behavior with peers?  no Stressors of note: no  Education: Plans to start [employer] school in June  Menstruation:   Menstrual History:  LMP 03/19/21, regular and lasts 3-4 days  Confidential Social History: Tobacco?  no Secondhand smoke exposure?  no Drugs/ETOH?  Used weed in April  Sexually Active?  yes   Pregnancy Prevention: sometimes use condom  Screenings: Patient has a dental home: needs to make appointment  The patient completed the Rapid Assessment for Adolescent Preventive Services screening questionnaire and the following topics were identified as risk factors and discussed: healthy eating, marijuana use and condom use  In addition, the following topics were discussed as part of anticipatory guidance healthy eating, exercise, condom use and birth control.  PHQ-9 completed and results indicated no signs of depression - difficulty with sleep and poor appetite.  Physical Exam:  Vitals:   04/03/21 1355  BP: 116/70  Pulse: 94  Weight: 130 lb 8 oz (59.2 kg)  Height: 4' 10.94" (1.497 m)   BP 116/70 (BP  Location: Right Arm, Patient Position: Sitting, Cuff Size: Normal)   Pulse 94   Ht 4' 10.94" (1.497 m)   Wt 130 lb 8 oz (59.2 kg)   BMI 26.41 kg/m  Body mass index: body mass index is 26.41 kg/m. Growth percentile SmartLinks can only be used for patients less than 47 years old.   Hearing Screening   Method: Audiometry   125Hz  250Hz  500Hz  1000Hz  2000Hz  3000Hz  4000Hz  6000Hz  8000Hz   Right ear:   20 20 20  20     Left ear:   20 20 20  20       Visual Acuity Screening   Right eye Left eye Both eyes  Without correction: 20/20 20/20 20/20   With correction:       General Appearance:   alert, oriented, no acute distress  HENT: Normocephalic, no obvious abnormality, conjunctiva clear  Mouth:   Normal appearing teeth, no obvious discoloration, dental caries, or dental caps  Neck:   Supple; thyroid: no enlargement, symmetric, no tenderness/mass/nodules  Chest Tanner V female, no masses  Lungs:   Clear to auscultation bilaterally, normal work of breathing  Heart:   Regular rate and rhythm, S1 and S2 normal, no murmurs;   Abdomen:   Soft, non-tender, no mass, or organomegaly  GU normal female external genitalia, pelvic not performed, Tanner stage V  Musculoskeletal:   Tone and strength strong and symmetrical, all extremities               Lymphatic:   No cervical adenopathy  Skin/Hair/Nails:   Skin warm, dry and intact, no rashes, no bruises or  petechiae  Neurologic:   Strength, gait, and coordination normal and age-appropriate     Assessment and Plan:   1. Encounter for routine adult health examination without abnormal findings Reviewed guidance for starting OCPs today.    2. Routine screening for STI (sexually transmitted infection) Negative GC/Chlamydia last month.  Patient declines repeat testing today - POCT Rapid HIV - negative  3. Screening for hyperlipidemia History of elevated triglycerides 5 years ago.  Repeat lipid panel due today. - Lipid panel  Hearing screening  result:normal Vision screening result: normal  Discussed transition to adult medical provider for her primary care services.Recommend patient call to schedule a new patient appointment.   Return if symptoms worsen or fail to improve, for call to schedule new patient appointment with adult medical provider.Clifton Custard, MD

## 2021-04-04 LAB — LIPID PANEL
Cholesterol: 157 mg/dL (ref <200)
HDL: 46 mg/dL — ABNORMAL LOW (ref >=50)
LDL Cholesterol (Calc): 96 mg/dL (calc)
Non-HDL Cholesterol (Calc): 111 mg/dL (calc) (ref <130)
Total CHOL/HDL Ratio: 3.4 (calc) (ref <5.0)
Triglycerides: 65 mg/dL (ref <150)

## 2021-05-10 ENCOUNTER — Encounter: Payer: Self-pay | Admitting: Pediatrics

## 2021-05-10 ENCOUNTER — Other Ambulatory Visit: Payer: Self-pay

## 2021-05-10 ENCOUNTER — Ambulatory Visit (INDEPENDENT_AMBULATORY_CARE_PROVIDER_SITE_OTHER): Payer: Medicaid Other | Admitting: Pediatrics

## 2021-05-10 ENCOUNTER — Other Ambulatory Visit (HOSPITAL_COMMUNITY)
Admission: RE | Admit: 2021-05-10 | Discharge: 2021-05-10 | Disposition: A | Discharge status: Home / Self Care | Payer: Medicaid Other | Source: Ambulatory Visit | Attending: Pediatrics | Admitting: Pediatrics

## 2021-05-10 VITALS — BP 114/77 | HR 79 | Ht 58.66 in | Wt 135.4 lb

## 2021-05-10 DIAGNOSIS — Z113 Encounter for screening for infections with a predominantly sexual mode of transmission: Secondary | ICD-10-CM | POA: Diagnosis not present

## 2021-05-10 DIAGNOSIS — Z3202 Encounter for pregnancy test, result negative: Secondary | ICD-10-CM

## 2021-05-10 DIAGNOSIS — Z3041 Encounter for surveillance of contraceptive pills: Secondary | ICD-10-CM | POA: Diagnosis not present

## 2021-05-10 DIAGNOSIS — Z7251 High risk heterosexual behavior: Secondary | ICD-10-CM

## 2021-05-10 LAB — POCT URINE PREGNANCY: Preg Test, Ur: NEGATIVE

## 2021-05-10 NOTE — Progress Notes (Signed)
History was provided by the patient.  Kathy Palmer is a 20 y.o. female who is here for Ocp f/u.  Ettefagh, Aron Baba, MD   HPI:  Pt reports she started taking OCP when she was on her period but kept forgetting so she hasn't been taking them. Still sexually active, using condoms sometimes.   LMP was June 2nd-6th. Last unprotected intercourse was the week after that. Again had unprotected sex last week. Did have some spotting last week after sex that lasted about 3 days. Bleeding was bright red. No pain with sex. Denies any itching, burning, discharge.   Does not want to be pregnant, doesn't think her partner desires pregnancy. They have been together six years.   Patient's last menstrual period was 04/19/2021.    Patient Active Problem List   Diagnosis Date Noted   Acne vulgaris 06/30/2018   Insomnia 11/08/2017   Anxiety 01/09/2015   Family disruption due to divorce 04/20/2013    Current Outpatient Medications on File Prior to Visit  Medication Sig Dispense Refill   Multiple Vitamin (MULTIVITAMIN) tablet Take 1 tablet by mouth daily.     levonorgestrel-ethinyl estradiol (AVIANE) 0.1-20 MG-MCG tablet Take 1 tablet by mouth daily. (Patient not taking: No sig reported) 84 tablet 3   No current facility-administered medications on file prior to visit.    No Known Allergies   Physical Exam:    Vitals:   05/10/21 0955  BP: 114/77  Pulse: 79  Weight: 135 lb 6.4 oz (61.4 kg)  Height: 4' 10.66" (1.49 m)    Growth percentile SmartLinks can only be used for patients less than 5 years old.  Physical Exam Vitals and nursing note reviewed.  Constitutional:      General: She is not in acute distress.    Appearance: She is well-developed.  Neck:     Thyroid: No thyromegaly.  Cardiovascular:     Rate and Rhythm: Normal rate and regular rhythm.     Heart sounds: No murmur heard. Pulmonary:     Breath sounds: Normal breath sounds.  Abdominal:     Palpations:  Abdomen is soft. There is no mass.     Tenderness: There is no abdominal tenderness. There is no guarding.  Musculoskeletal:     Right lower leg: No edema.     Left lower leg: No edema.  Lymphadenopathy:     Cervical: No cervical adenopathy.  Skin:    General: Skin is warm.     Capillary Refill: Capillary refill takes less than 2 seconds.     Findings: No rash.  Neurological:     Mental Status: She is alert.     Comments: No tremor    Assessment/Plan: 1. Unprotected sexual intercourse Discussed risks of pregnancy. Last unprotected was over 5 days ago so unable to give any EC at this time. Counseled to wait until menstrual cycle starts and then restart OCP. Use condoms until that time. Continue multivitamin.   2. Encounter for birth control pills maintenance Will try to start again. Counseled on other methods but she still wants to take pills. Does not desire pregnancy.   3. Routine screening for STI (sexually transmitted infection) Will screen for infection given bleeding- I suspect it was r/t taking ocp inconsistently.  - Urine cytology ancillary only  4. Pregnancy examination or test, negative result Negative today.  - POCT urine pregnancy  Return in 8 weeks or sooner as needed   Alfonso Ramus, FNP

## 2021-05-10 NOTE — Patient Instructions (Signed)
Start taking birth control pills again on the day you start bleeding. Keep them somewhere convenient and set an alarm on your phone. If you don't get your period, take a pregnancy test  Continue daily vitamin

## 2021-05-11 LAB — URINE CYTOLOGY ANCILLARY ONLY
Bacterial Vaginitis-Urine: NEGATIVE
Candida Urine: NEGATIVE
Chlamydia: NEGATIVE
Comment: NEGATIVE
Comment: NEGATIVE
Comment: NORMAL
Neisseria Gonorrhea: NEGATIVE
Trichomonas: NEGATIVE

## 2021-07-05 ENCOUNTER — Ambulatory Visit: Payer: Medicaid Other | Admitting: Pediatrics

## 2021-08-07 ENCOUNTER — Ambulatory Visit (INDEPENDENT_AMBULATORY_CARE_PROVIDER_SITE_OTHER): Payer: Medicaid Other | Admitting: Pediatrics

## 2021-08-07 ENCOUNTER — Other Ambulatory Visit: Payer: Self-pay

## 2021-08-07 ENCOUNTER — Encounter: Payer: Self-pay | Admitting: Pediatrics

## 2021-08-07 VITALS — Temp 98.1°F | Wt 142.0 lb

## 2021-08-07 DIAGNOSIS — R509 Fever, unspecified: Secondary | ICD-10-CM | POA: Diagnosis not present

## 2021-08-07 DIAGNOSIS — J029 Acute pharyngitis, unspecified: Secondary | ICD-10-CM | POA: Diagnosis not present

## 2021-08-07 DIAGNOSIS — Z3202 Encounter for pregnancy test, result negative: Secondary | ICD-10-CM | POA: Diagnosis not present

## 2021-08-07 DIAGNOSIS — Z7251 High risk heterosexual behavior: Secondary | ICD-10-CM | POA: Diagnosis not present

## 2021-08-07 DIAGNOSIS — Z3200 Encounter for pregnancy test, result unknown: Secondary | ICD-10-CM

## 2021-08-07 DIAGNOSIS — J101 Influenza due to other identified influenza virus with other respiratory manifestations: Secondary | ICD-10-CM | POA: Diagnosis not present

## 2021-08-07 LAB — POCT RAPID STREP A (OFFICE): Rapid Strep A Screen: NEGATIVE

## 2021-08-07 LAB — POC INFLUENZA A&B (BINAX/QUICKVUE)
Influenza A, POC: POSITIVE — AB
Influenza B, POC: NEGATIVE

## 2021-08-07 LAB — POC SOFIA SARS ANTIGEN FIA: SARS Coronavirus 2 Ag: NEGATIVE

## 2021-08-07 LAB — POCT URINE PREGNANCY: Preg Test, Ur: NEGATIVE

## 2021-08-07 MED ORDER — OSELTAMIVIR PHOSPHATE 75 MG PO CAPS: 75.0000 mg | ORAL_CAPSULE | Freq: Two times a day (BID) | ORAL | 0 refills | Status: AC

## 2021-08-07 NOTE — Progress Notes (Signed)
PCP: Clifton Custard, MD   Chief Complaint  Patient presents with   Sore Throat    Started last night with sore throat and fever pt states that she did vomit last night as well.   Fever    Had ibuprofen this morning at 8:00am tried to eat after and vomited the medicine.    Subjective:  HPI:  Kathy Palmer is a 20 y.o. female presenting with a sore throat.   Started 1 days ago. Max T: not measured, took Motrin yesterday  Associated symptoms: post-tussive emesis.  Not much congestion.  Drinking: trying.  Taking some decaf teas and other fluids.  Appetite: low  Voiding: normal  Sick contacts: two younger cousins.  No known flu or COVID exposure.  Social: Goes to school at [employer].  Patient also requesting urine pregnancy test.  Last unprotected sex was 07/24/21.  Has not started OCPs prescribed by adolescent clinic in June 2022.   Usually uses condoms, but not always.     Meds: Current Outpatient Medications  Medication Sig Dispense Refill   Multiple Vitamin (MULTIVITAMIN) tablet Take 1 tablet by mouth daily.     oseltamivir (TAMIFLU) 75 MG capsule Take 1 capsule (75 mg total) by mouth 2 (two) times daily for 5 days. 10 capsule 0   levonorgestrel-ethinyl estradiol (AVIANE) 0.1-20 MG-MCG tablet Take 1 tablet by mouth daily. (Patient not taking: No sig reported) 84 tablet 3   No current facility-administered medications for this visit.    ALLERGIES: No Known Allergies  Family history: Family History  Problem Relation Age of Onset   Healthy Mother      Objective:   Physical Examination:  Temp: 98.1 F (36.7 C) (Temporal) Pulse:   BP:   (Growth percentile SmartLinks can only be used for patients less than 42 years old.)  Wt: 142 lb (64.4 kg)  Ht:    BMI: Body mass index is 29.01 kg/m. (Facility age limit for growth percentiles is 20 years.) GENERAL: Well appearing, no distress HEENT: NCAT, clear sclerae, TMs normal bilaterally, scant nasal discharge, dried  blood over capillary bed in left nare, + tonsillary erythema w/minimal bilateral exudate, no evidence of uvula deviation NECK: Supple, no cervical LAD LUNGS: EWOB, CTAB, no wheeze, no crackles CARDIO: RRR, normal S1S2 no murmur, well perfused ABDOMEN: Normoactive bowel sounds, soft, ND/NT, no masses or organomegaly EXTREMITIES: Warm and well perfused   Assessment/Plan:   Kathy Palmer is a 20 y.o. old female here for with viral URI due to influenza A.  POC strep negative. POC flu and COVID also normal.  Well appearing, afebrile (after Motrin), and hydrated with no associated pneumonia or AOM.  Updated patient by phone with results after visit.   Influenza A Discussed normal course of illness and reasons to return which include the following: -difficulty breathing  -dehydration (less than half normal number/quantity of urine) -improvement followed by acute worsening  Provided school note via MyChart   Supportive care including: -Tamiflu to reduce duration of sx. Advised of possible side effects. -Frequent fluids- even if just small sips   -Tylenol alternating with ibuprofen at appropriate dose for weight. Recommended ibuprofen with food.  -1 teaspoon honey with warm liquid to coat throat; CANNOT give <1yo.  Encounter for pregnancy test, result unknown Unprotected sex  Urine pregnancy test negative.  Advised to retest around 08/23/21 based on last unprotected sex.  Encouraged condoms -- declined condoms today.  Encouraged her to start OCPs prescribed by [facility].  Recommend quick-start method.  Follow up: PRN for worsening symptoms.  Recommend transition to adult medicine for primary care.  Provided adult PCP list.    Enis Gash, MD  [facility]

## 2021-08-07 NOTE — Patient Instructions (Signed)
  As your medical provider, it is important to me that you continue to receive high-quality primary care services as you transition to adulthood.  After the age of 44, you can no longer be seen at the [facility] for your primary care health services.   Below is a list of adult medicine practices that are currently accepting new patients.  Please reach out to one of these practices to schedule a new patient appointment as soon as possible.  Please be aware that you will not be able to be seen at our office after your 22nd birthday.   Adult Primary Care Clinics Name Criteria Services   [facility]  Address: [address] [place], [place] [address]  Phone: [phone] Hours: Monday - Friday 9 AM -6 PM  Types of insurance accepted:  Commercial insurance [facility] (orange card) [employer] Uninsured  Language services:  Video and phone interpreters available   Ages 23 and older    Adult primary care Onsite pharmacy Integrated behavioral health Financial assistance counseling Walk-in hours for established patients  Financial assistance counseling hours: Tuesdays 2:00PM - 5:00PM  Thursday 8:30AM - 4:30PM  Space is limited, 10 on Tuesday and 20 on Thursday on a first come, first serve basis  Name Criteria Services   [facility]  Address: [address] [place], [place] [address]  Phone: [phone]  Hours: Monday - Friday 8:30 AM - 5 PM  Types of insurance accepted:  Nurse, learning disability Medicaid Medicare Uninsured  Language services:  Video and phone interpreters available   All ages - newborn to adult   Primary care for all ages (children and adults) Integrated behavioral health Nutritionist Financial assistance counseling   Name Criteria Services   [facility]  Located on the ground floor of  [facility]  Address: [address]  [place],  [place]  [address]  Phone: [phone]  Hours: Monday - Friday 8:15 AM - 5 PM  Types of insurance accepted:  Commercial insurance Medicaid Medicare Uninsured  Language services:  Video and phone interpreters available   Ages 42 and older   Adult primary care Nutritionist Certified Diabetes Educator  Integrated behavioral health Financial assistance counseling   Name Criteria Services   [facility]  Address: [address] [place], [place] [address]  Phone: [phone]  Hours: Monday - Friday 8:30 AM - 5 PM    Types of insurance accepted:  Nurse, learning disability Medicaid Medicare Uninsured  Language services:  Video and phone interpreters available   All ages - newborn to adult   Primary care for all ages (children and adults) Integrated behavioral health Financial assistance counseling

## 2021-08-09 LAB — CULTURE, GROUP A STREP
MICRO NUMBER:: 12401841
SPECIMEN QUALITY:: ADEQUATE

## 2021-09-22 ENCOUNTER — Other Ambulatory Visit: Payer: Self-pay

## 2021-09-22 ENCOUNTER — Ambulatory Visit
Admission: EM | Admit: 2021-09-22 | Discharge: 2021-09-22 | Disposition: A | Discharge status: Home / Self Care | Payer: Medicaid Other

## 2021-09-22 DIAGNOSIS — T1592XA Foreign body on external eye, part unspecified, left eye, initial encounter: Secondary | ICD-10-CM | POA: Diagnosis not present

## 2021-09-22 NOTE — Discharge Instructions (Signed)
Please go to the emergency department as soon as you leave urgent care for further evaluation and management. ?

## 2021-09-22 NOTE — ED Triage Notes (Signed)
Pt c/o left eye redness and irritation. Also c/o pain. Onset Wednesday states she thinks it may be related to eye makeup product. Associated drainage.   Denies pruritis or vision changes.

## 2021-09-22 NOTE — ED Provider Notes (Signed)
[facility]    CSN: [id number] Arrival date & time: 09/22/21  0905      History   Chief Complaint Chief Complaint  Patient presents with   left eye injury    HPI Kathy Palmer is a 20 y.o. female.   Patient presents with left eye redness, drainage, discomfort that started approximately 4 days ago.  Patient denies any pain or itchiness to the eye.  Has had purulent drainage from the eye.  Denies any direct trauma to the eye.  Patient reports that she used a new make-up on the inner part of her eye and symptoms started directly after.  Denies any blurry vision.  Denies any upper respiratory symptoms.  Denies any fevers.    Past Medical History:  Diagnosis Date   Anxiety    Deliberate self-cutting    Deliberate self-cutting 04/20/2013   Panic attack 01/09/2015    Patient Active Problem List   Diagnosis Date Noted   Encounter for birth control pills maintenance 05/10/2021   Acne vulgaris 06/30/2018   Insomnia 11/08/2017   Anxiety 01/09/2015   Family disruption due to divorce 04/20/2013    Past Surgical History:  Procedure Laterality Date   EYE SURGERY      OB History   No obstetric history on file.      Home Medications    Prior to Admission medications   Medication Sig Start Date End Date Taking? Authorizing Provider  levonorgestrel-ethinyl estradiol (AVIANE) 0.1-20 MG-MCG tablet Take 1 tablet by mouth daily. Patient not taking: No sig reported 03/07/21   Alfonso Ramus T, FNP  Multiple Vitamin (MULTIVITAMIN) tablet Take 1 tablet by mouth daily.    [provider]    Family History Family History  Problem Relation Age of Onset   Healthy Mother     Social History Social History   Tobacco Use   Smoking status: Never   Smokeless tobacco: Never  Substance Use Topics   Alcohol use: No   Drug use: No     Allergies   Patient has no known allergies.   Review of Systems Review of Systems Per HPI  Physical  Exam Triage Vital Signs ED Triage Vitals [09/22/21 1041]  Enc Vitals Group     BP 114/83     Pulse Rate 78     Resp 18     Temp 98.1 F (36.7 C)     Temp Source Oral     SpO2 98 %     Weight      Height      Head Circumference      Peak Flow      Pain Score 4     Pain Loc      Pain Edu?      Excl. in GC?    No data found.  Updated Vital Signs BP 114/83 (BP Location: Left Arm)   Pulse 78   Temp 98.1 F (36.7 C) (Oral)   Resp 18   SpO2 98%   Visual Acuity Right Eye Distance: 20/20 Left Eye Distance: 20/20 Bilateral Distance: 20/20  Right Eye Near:   Left Eye Near:    Bilateral Near:     Physical Exam Constitutional:      General: She is not in acute distress.    Appearance: Normal appearance. She is not toxic-appearing or diaphoretic.  HENT:     Head: Normocephalic and atraumatic.  Eyes:     General: Lids are normal. Vision grossly intact. Gaze aligned  appropriately.        Right eye: No foreign body, discharge or hordeolum.        Left eye: Foreign body present.No discharge or hordeolum.     Extraocular Movements: Extraocular movements intact.     Conjunctiva/sclera: Conjunctivae normal.     Left eye: Left conjunctiva is not injected. No chemosis, exudate or hemorrhage.    Pupils: Pupils are equal, round, and reactive to light.     Comments: Patient has pinpoint white foreign body present to right lower portion of left iris.  Erythema noted throughout eye.  No swelling to lids.  No pain with extraocular movements.  Pulmonary:     Effort: Pulmonary effort is normal.  Neurological:     General: No focal deficit present.     Mental Status: She is alert and oriented to person, place, and time. Mental status is at baseline.  Psychiatric:        Mood and Affect: Mood normal.        Behavior: Behavior normal.        Thought Content: Thought content normal.        Judgment: Judgment normal.     UC Treatments / Results  Labs (all labs ordered are listed, but  only abnormal results are displayed) Labs Reviewed - No data to display  EKG   Radiology No results found.  Procedures Procedures (including critical care time)  Medications Ordered in UC Medications - No data to display  Initial Impression / Assessment and Plan / UC Course  I have reviewed the triage vital signs and the nursing notes.  Pertinent labs & imaging results that were available during my care of the patient were reviewed by me and considered in my medical decision making (see chart for details).     It appears that patient has a foreign body to the left eye.  Attempted to gently remove foreign body with wash and sterile cotton tip applicator after using tetracaine but was unsuccessful.  Attempted to reach on-call ophthalmology for further evaluation.  Had to leave voicemail and did not receive a return call.  Advised patient that foreign body will need to be removed as soon as possible.  Patient was advised to go to the hospital for further evaluation and management.  Patient was agreeable plan.  Vital signs stable at discharge.  Visual acuity normal in urgent care.  Agree with patient self transport to the hospital. Final Clinical Impressions(s) / UC Diagnoses   Final diagnoses:  Foreign body of left eye, initial encounter     Discharge Instructions      Please go to the emergency department as soon as you leave urgent care for further evaluation and management.     ED Prescriptions   None    PDMP not reviewed this encounter.   Gustavus Bryant, [place] 09/22/21 [phone]

## 2021-09-24 DIAGNOSIS — H538 Other visual disturbances: Secondary | ICD-10-CM | POA: Diagnosis not present

## 2022-05-13 ENCOUNTER — Encounter (HOSPITAL_COMMUNITY): Payer: Self-pay

## 2022-05-13 ENCOUNTER — Ambulatory Visit (HOSPITAL_COMMUNITY)
Admission: EM | Admit: 2022-05-13 | Discharge: 2022-05-13 | Disposition: A | Discharge status: Home / Self Care | Payer: Medicaid Other | Attending: Physician Assistant | Admitting: Physician Assistant

## 2022-05-13 DIAGNOSIS — B349 Viral infection, unspecified: Secondary | ICD-10-CM

## 2022-05-13 DIAGNOSIS — R112 Nausea with vomiting, unspecified: Secondary | ICD-10-CM

## 2022-05-13 LAB — POCT URINALYSIS DIPSTICK, ED / UC
Glucose, UA: NEGATIVE mg/dL
Ketones, ur: 40 mg/dL — AB
Leukocytes,Ua: NEGATIVE
Nitrite: NEGATIVE
Protein, ur: 100 mg/dL — AB
Specific Gravity, Urine: 1.025 (ref 1.005–1.030)
Urobilinogen, UA: 0.2 mg/dL (ref 0.0–1.0)
pH: 5.5 (ref 5.0–8.0)

## 2022-05-13 LAB — POCT RAPID STREP A, ED / UC: Streptococcus, Group A Screen (Direct): NEGATIVE

## 2022-05-13 LAB — POC URINE PREG, ED
Preg Test, Ur: NEGATIVE
Preg Test, Ur: NEGATIVE

## 2022-05-13 MED ORDER — ONDANSETRON 4 MG PO TBDP: 4.0000 mg | ORAL_TABLET | Freq: Three times a day (TID) | ORAL | 0 refills | Status: DC | PRN

## 2022-05-13 MED ORDER — ONDANSETRON 4 MG PO TBDP
ORAL_TABLET | ORAL | Status: AC
  Filled 2022-05-13: qty 1

## 2022-05-13 MED ORDER — ONDANSETRON 4 MG PO TBDP
4.0000 mg | ORAL_TABLET | Freq: Once | ORAL | Status: AC
  Administered 2022-05-13: 4 mg via ORAL

## 2022-09-08 ENCOUNTER — Encounter (HOSPITAL_COMMUNITY): Payer: Self-pay | Admitting: Emergency Medicine

## 2022-09-08 DIAGNOSIS — R251 Tremor, unspecified: Secondary | ICD-10-CM

## 2023-01-31 ENCOUNTER — Ambulatory Visit: Payer: Medicaid Other | Attending: Internal Medicine | Admitting: Internal Medicine

## 2023-01-31 ENCOUNTER — Encounter: Payer: Self-pay | Admitting: Internal Medicine

## 2023-01-31 VITALS — BP 110/74 | HR 78 | Temp 98.1°F | Ht <= 58 in | Wt 160.0 lb

## 2023-01-31 DIAGNOSIS — E66811 Obesity, class 1: Secondary | ICD-10-CM

## 2023-01-31 DIAGNOSIS — E669 Obesity, unspecified: Secondary | ICD-10-CM | POA: Diagnosis not present

## 2023-01-31 DIAGNOSIS — Z23 Encounter for immunization: Secondary | ICD-10-CM | POA: Diagnosis not present

## 2023-01-31 DIAGNOSIS — Z3202 Encounter for pregnancy test, result negative: Secondary | ICD-10-CM | POA: Diagnosis not present

## 2023-01-31 DIAGNOSIS — N911 Secondary amenorrhea: Secondary | ICD-10-CM

## 2023-01-31 DIAGNOSIS — Z3009 Encounter for other general counseling and advice on contraception: Secondary | ICD-10-CM | POA: Diagnosis not present

## 2023-01-31 DIAGNOSIS — Z7689 Persons encountering health services in other specified circumstances: Secondary | ICD-10-CM | POA: Diagnosis not present

## 2023-01-31 DIAGNOSIS — Z6833 Body mass index (BMI) 33.0-33.9, adult: Secondary | ICD-10-CM

## 2023-01-31 LAB — POCT URINE PREGNANCY: Preg Test, Ur: NEGATIVE

## 2023-01-31 NOTE — Patient Instructions (Signed)

## 2023-01-31 NOTE — Progress Notes (Signed)
Patient ID: Kathy Palmer, female    DOB: 07-29-2001  MRN: [id number]  CC: Establish Care (Est care / new pt. Kinnie Feil pregnancy test - missed period/Yes to flu vax. Yes to sched pap visit for another day.)   Subjective: Kathy Palmer is a 22 y.o. female who presents for new pt visit Her concerns today include:   Previous PCP was her pediatric physician No chronic med conditions or meds.  Not on BC. Requesting pregnancy test.  About 1 wk late for menses.  Last menses was 2/12-15/2024. Sexually active with one female partner.  They do not use condoms.  Not wanting to get pregnant.  Had Nexplanon for 1-1.5 yrs; removed because "I didn't want to be on it any more."  She felt "off."  Not able to give more information on how it made her feel off.  Then got of BCP but often forgot to take it.  No previous pregnancy.  Concerned about her weight.  She eats a lot of chips and candies.  Does not drink sodas or juices.  Drinks mainly water. Patient Active Problem List   Diagnosis Date Noted   Encounter for birth control pills maintenance 05/10/2021   Acne vulgaris 06/30/2018   Insomnia 11/08/2017   Anxiety 01/09/2015   Family disruption due to divorce 04/20/2013     Current Outpatient Medications on File Prior to Visit  Medication Sig Dispense Refill   levonorgestrel-ethinyl estradiol (AVIANE) 0.1-20 MG-MCG tablet Take 1 tablet by mouth daily. (Patient not taking: Reported on 04/03/2021) 84 tablet 3   Multiple Vitamin (MULTIVITAMIN) tablet Take 1 tablet by mouth daily. (Patient not taking: Reported on 01/31/2023)     No current facility-administered medications on file prior to visit.    No Known Allergies  Social History   Socioeconomic History   Marital status: Single    Spouse name: Not on file   Number of children: Not on file   Years of education: Not on file   Highest education level: Not on file  Occupational History   Not on file  Tobacco Use   Smoking  status: Never   Smokeless tobacco: Never  Vaping Use   Vaping Use: Never used  Substance and Sexual Activity   Alcohol use: No   Drug use: Not Currently    Types: MDMA (Ecstacy)   Sexual activity: Never    Birth control/protection: Implant  Other Topics Concern   Not on file  Social History Narrative   Had a very difficult year because parents are divorced.  Maternal GF also passed away this past year.     Social Determinants of Health   Financial Resource Strain: Not on file  Food Insecurity: Not on file  Transportation Needs: Not on file  Physical Activity: Not on file  Stress: Not on file  Social Connections: Not on file  Intimate Partner Violence: Not on file    Family History  Problem Relation Age of Onset   Arthritis Mother    High blood pressure Father     Past Surgical History:  Procedure Laterality Date   EYE SURGERY      ROS: Review of Systems Negative except as stated above  PHYSICAL EXAM: BP 110/74   Pulse 78   Temp 98.1 F (36.7 C) (Oral)   Ht 4\' 10"  (1.473 m)   Wt 160 lb (72.6 kg)   SpO2 99%   BMI 33.44 kg/m   Physical Exam  General appearance - alert, well appearing,  young Hispanic female and in no distress Mental status - normal mood, behavior, speech, dress, motor activity, and thought processes Chest - clear to auscultation, no wheezes, rales or rhonchi, symmetric air entry Heart - normal rate, regular rhythm, normal S1, S2, no murmurs, rubs, clicks or gallops Extremities - peripheral pulses normal, no pedal edema, no clubbing or cyanosis      Latest Ref Rng & Units 09/12/2015    4:13 PM  CMP  Glucose 65 - 99 mg/dL 77   BUN 7 - 20 mg/dL 14   Creatinine 0.40 - 1.00 mg/dL 0.55   Sodium 135 - 146 mmol/L 139   Potassium 3.8 - 5.1 mmol/L 4.3   Chloride 98 - 110 mmol/L 104   CO2 20 - 31 mmol/L 26   Calcium 8.9 - 10.4 mg/dL 9.7   Total Protein 6.3 - 8.2 g/dL 7.8   Total Bilirubin 0.2 - 1.1 mg/dL 0.7   Alkaline Phos 41 - 244 U/L 63    AST 12 - 32 U/L 17   ALT 6 - 19 U/L 11    Lipid Panel     Component Value Date/Time   CHOL 157 04/03/2021 1445   TRIG 65 04/03/2021 1445   HDL 46 (L) 04/03/2021 1445   CHOLHDL 3.4 04/03/2021 1445   VLDL 28 09/12/2015 1613   LDLCALC 96 04/03/2021 1445    CBC    Component Value Date/Time   WBC 8.2 09/12/2015 1613   RBC 4.53 09/12/2015 1613   HGB 12.9 09/12/2015 1613   HCT 38.3 09/12/2015 1613   PLT 339 09/12/2015 1613   MCV 84.5 09/12/2015 1613   MCH 28.5 09/12/2015 1613   MCHC 33.7 09/12/2015 1613   RDW 13.8 09/12/2015 1613   LYMPHSABS 3.0 09/12/2015 1613   MONOABS 0.5 09/12/2015 1613   EOSABS 0.2 09/12/2015 1613   BASOSABS 0.0 09/12/2015 1613   Results for orders placed or performed in visit on 01/31/23  POCT urine pregnancy  Result Value Ref Range   Preg Test, Ur Negative Negative     ASSESSMENT AND PLAN: 1. Establishing care with new doctor, encounter for  2. Contraceptive education Martin Majestic over contraceptive options including Depo-Provera shot, NuvaRing, IUD.  Patient thinks she will like to have IUD placed but wants to think about it first.  She will let me know when she returns for her Pap smear in 6 weeks.  Encouraged use of condoms in the meantime. - POCT urine pregnancy  3. Secondary amenorrhea POC urine pregnancy test negative.   - POCT urine pregnancy  4. Obesity (BMI 30.0-34.9) Discussed on encourage healthy eating habits.  1 major change she intends to make between now and next visit is to cut out the snacking of chips and candies.  Advised to snack on fruits or nuts instead. Encouraged her to get in some form of moderate intensity exercise like brisk walking 5 days a week for 30 minutes.     Patient was given the opportunity to ask questions.  Patient verbalized understanding of the plan and was able to repeat key elements of the plan.   This documentation was completed using Radio producer.  Any transcriptional errors are  unintentional.  Orders Placed This Encounter  Procedures   Flu Vaccine QUAD 11mo+IM (Fluarix, Fluzone & Alfiuria Quad PF)   POCT urine pregnancy     Requested Prescriptions    No prescriptions requested or ordered in this encounter    Return in about 6 weeks (around 03/14/2023)  for PAP.  Karle Plumber, MD, FACP

## 2023-04-04 ENCOUNTER — Ambulatory Visit: Payer: Medicaid Other | Admitting: Internal Medicine

## 2023-05-23 ENCOUNTER — Telehealth: Payer: Self-pay

## 2023-05-23 NOTE — Telephone Encounter (Signed)
Copied from CRM [id number]. Topic: General - Other >> May 21, 2023 11:40 AM Epimenio Foot F wrote: Reason for CRM: Pt is calling in because she has an appointment scheduled for August but her insurance has been cancelled. Pt wants to know how much would a pap cost out of pocket. Please follow up with pt.

## 2023-06-20 ENCOUNTER — Other Ambulatory Visit (HOSPITAL_COMMUNITY)
Admission: RE | Admit: 2023-06-20 | Discharge: 2023-06-20 | Disposition: A | Discharge status: Home / Self Care | Payer: Self-pay | Source: Ambulatory Visit | Attending: Internal Medicine | Admitting: Internal Medicine

## 2023-06-20 ENCOUNTER — Ambulatory Visit: Payer: Self-pay | Attending: Internal Medicine | Admitting: Internal Medicine

## 2023-06-20 VITALS — BP 110/72 | HR 86 | Temp 98.3°F | Ht <= 58 in | Wt 159.0 lb

## 2023-06-20 DIAGNOSIS — Z124 Encounter for screening for malignant neoplasm of cervix: Secondary | ICD-10-CM | POA: Insufficient documentation

## 2023-06-20 NOTE — Progress Notes (Signed)
Patient ID: Kathy Palmer, female    DOB: 01-14-2001  MRN: [id number]  CC: Gynecologic Exam (Pap. /No questions / concerns)   Subjective: Kathy Palmer is a 22 y.o. female who presents for PAP Her concerns today include:  Pt with obesity  GYN History:  Pt is G0P0 Any hx of abn paps?: this is 1st pap Menses regular or irregular?:  regular How long does menses last?  4 days Menstrual flow light or heavy?: heavy 1st two days then moderate Method of birth control?:  none and does not want to be on anything at this time.   Any vaginal dischg at this time?: no Dysuria?: no Any hx of STI?:  no Sexually active with how many partners: yes, one female partner Desires STI screen:  no and declines HIV screen as well Last MMG: NA Family hx of uterine, cervical or breast cancer?:  no   Patient Active Problem List   Diagnosis Date Noted   Obesity (BMI 30.0-34.9) 01/31/2023   Encounter for birth control pills maintenance 05/10/2021   Acne vulgaris 06/30/2018   Insomnia 11/08/2017   Anxiety 01/09/2015   Family disruption due to divorce 04/20/2013     Current Outpatient Medications on File Prior to Visit  Medication Sig Dispense Refill   levonorgestrel-ethinyl estradiol (AVIANE) 0.1-20 MG-MCG tablet Take 1 tablet by mouth daily. (Patient not taking: Reported on 04/03/2021) 84 tablet 3   Multiple Vitamin (MULTIVITAMIN) tablet Take 1 tablet by mouth daily. (Patient not taking: Reported on 01/31/2023)     No current facility-administered medications on file prior to visit.    No Known Allergies  Social History   Socioeconomic History   Marital status: Single    Spouse name: Not on file   Number of children: Not on file   Years of education: Not on file   Highest education level: Some college, no degree  Occupational History   Not on file  Tobacco Use   Smoking status: Never   Smokeless tobacco: Never  Vaping Use   Vaping status: Never Used  Substance and  Sexual Activity   Alcohol use: No   Drug use: Not Currently    Types: MDMA (Ecstacy)   Sexual activity: Never    Birth control/protection: Implant  Other Topics Concern   Not on file  Social History Narrative   Had a very difficult year because parents are divorced.  Maternal GF also passed away this past year.     Social Determinants of Health   Financial Resource Strain: Patient Declined (06/20/2023)   Overall Financial Resource Strain (CARDIA)    Difficulty of Paying Living Expenses: Patient declined  Food Insecurity: No Food Insecurity (06/20/2023)   Hunger Vital Sign    Worried About Running Out of Food in the Last Year: Never true    Ran Out of Food in the Last Year: Never true  Transportation Needs: No Transportation Needs (06/20/2023)   PRAPARE - Administrator, Civil Service (Medical): No    Lack of Transportation (Non-Medical): No  Physical Activity: Unknown (06/20/2023)   Exercise Vital Sign    Days of Exercise per Week: 0 days    Minutes of Exercise per Session: Not on file  Stress: No Stress Concern Present (06/20/2023)   [employer] of Occupational Health - Occupational Stress Questionnaire    Feeling of Stress : Only a little  Social Connections: Socially Isolated (06/20/2023)   Social Connection and Isolation Panel [NHANES]  Frequency of Communication with Friends and Family: More than three times a week    Frequency of Social Gatherings with Friends and Family: More than three times a week    Attends Religious Services: Never    Database administrator or Organizations: No    Attends Engineer, structural: Not on file    Marital Status: Never married  Intimate Partner Violence: Not on file    Family History  Problem Relation Age of Onset   Arthritis Mother    High blood pressure Father     Past Surgical History:  Procedure Laterality Date   EYE SURGERY      ROS: Review of Systems Negative except as stated above  PHYSICAL  EXAM: BP 110/72 (BP Location: Left Arm, Patient Position: Sitting, Cuff Size: Normal)   Pulse 86   Temp 98.3 F (36.8 C) (Oral)   Ht 4\' 10"  (1.473 m)   Wt 159 lb (72.1 kg)   SpO2 98%   BMI 33.23 kg/m   Physical Exam  General appearance - alert, well appearing, and in no distress Mental status - normal mood, behavior, speech, dress, motor activity, and thought processes Pelvic - CMA Clarisa present: Normal external genitalia, vulva, vagina, cervix, uterus and adnexa      Latest Ref Rng & Units 09/12/2015    4:13 PM  CMP  Glucose 65 - 99 mg/dL 77   BUN 7 - 20 mg/dL 14   Creatinine 2.44 - 1.00 mg/dL 0.10   Sodium 272 - 536 mmol/L 139   Potassium 3.8 - 5.1 mmol/L 4.3   Chloride 98 - 110 mmol/L 104   CO2 20 - 31 mmol/L 26   Calcium 8.9 - 10.4 mg/dL 9.7   Total Protein 6.3 - 8.2 g/dL 7.8   Total Bilirubin 0.2 - 1.1 mg/dL 0.7   Alkaline Phos 41 - 244 U/L 63   AST 12 - 32 U/L 17   ALT 6 - 19 U/L 11    Lipid Panel     Component Value Date/Time   CHOL 157 04/03/2021 1445   TRIG 65 04/03/2021 1445   HDL 46 (L) 04/03/2021 1445   CHOLHDL 3.4 04/03/2021 1445   VLDL 28 09/12/2015 1613   LDLCALC 96 04/03/2021 1445    CBC    Component Value Date/Time   WBC 8.2 09/12/2015 1613   RBC 4.53 09/12/2015 1613   HGB 12.9 09/12/2015 1613   HCT 38.3 09/12/2015 1613   PLT 339 09/12/2015 1613   MCV 84.5 09/12/2015 1613   MCH 28.5 09/12/2015 1613   MCHC 33.7 09/12/2015 1613   RDW 13.8 09/12/2015 1613   LYMPHSABS 3.0 09/12/2015 1613   MONOABS 0.5 09/12/2015 1613   EOSABS 0.2 09/12/2015 1613   BASOSABS 0.0 09/12/2015 1613    ASSESSMENT AND PLAN:  1. Pap smear for cervical cancer screening - Cytology - PAP    Patient was given the opportunity to ask questions.  Patient verbalized understanding of the plan and was able to repeat key elements of the plan.   This documentation was completed using Paediatric nurse.  Any transcriptional errors are  unintentional.  No orders of the defined types were placed in this encounter.    Requested Prescriptions    No prescriptions requested or ordered in this encounter    No follow-ups on file.  Jonah Blue, MD, FACP

## 2023-10-13 ENCOUNTER — Ambulatory Visit (INDEPENDENT_AMBULATORY_CARE_PROVIDER_SITE_OTHER): Payer: Self-pay

## 2023-10-13 ENCOUNTER — Encounter (HOSPITAL_COMMUNITY): Payer: Self-pay | Admitting: Emergency Medicine

## 2023-10-13 ENCOUNTER — Other Ambulatory Visit: Payer: Self-pay

## 2023-10-13 ENCOUNTER — Ambulatory Visit (HOSPITAL_COMMUNITY)
Admission: EM | Admit: 2023-10-13 | Discharge: 2023-10-13 | Disposition: A | Discharge status: Home / Self Care | Payer: Self-pay | Attending: Family Medicine | Admitting: Family Medicine

## 2023-10-13 DIAGNOSIS — S8265XA Nondisplaced fracture of lateral malleolus of left fibula, initial encounter for closed fracture: Secondary | ICD-10-CM

## 2023-10-13 MED ORDER — KETOROLAC TROMETHAMINE 30 MG/ML IJ SOLN
30.0000 mg | Freq: Once | INTRAMUSCULAR | Status: AC
Start: 2023-10-13 — End: 2023-10-13
  Administered 2023-10-13: 30 mg via INTRAMUSCULAR

## 2023-10-13 MED ORDER — KETOROLAC TROMETHAMINE 30 MG/ML IJ SOLN
INTRAMUSCULAR | Status: AC
  Filled 2023-10-13: qty 1

## 2023-10-13 MED ORDER — KETOROLAC TROMETHAMINE 10 MG PO TABS: 10.0000 mg | ORAL_TABLET | Freq: Four times a day (QID) | ORAL | 0 refills | Status: DC | PRN

## 2023-10-13 NOTE — Discharge Instructions (Addendum)
There is a small fracture at the tip of your fibula bone in the left ankle.  It is where the ligament pulled off a little chip of bone.  You have been given a shot of Toradol 30 mg today.  Ketorolac 10 mg tablets--take 1 tablet every 6 hours as needed for pain.  This is the same medicine that is in the shot we just gave you

## 2023-10-13 NOTE — ED Triage Notes (Signed)
Early Sunday morning around 3 am, patient fell.  Patient was running/jogging in some uggs, reports clog shaped shoe/platform.  Patient reports rolling left ankle and now has pain to lateral ankle.    Patient has taken tylenol

## 2023-10-13 NOTE — ED Provider Notes (Signed)
[facility]    CSN: [id number] Arrival date & time: 10/13/23  1325      History   Chief Complaint Chief Complaint  Patient presents with   Fall    HPI Kathy Palmer is a 22 y.o. female.    Fall  Here for pain in her left lateral ankle and lateral foot including along the lateral border of the foot. Yesterday morning early she was running in some o'clock shape platform shoes and she turned her left ankle.  And now hurts along the malleolus and inferior to it  Last menstrual cycle was November 8  No allergies to medications    Past Medical History:  Diagnosis Date   Anxiety    Deliberate self-cutting    Deliberate self-cutting 04/20/2013   Panic attack 01/09/2015    Patient Active Problem List   Diagnosis Date Noted   Obesity (BMI 30.0-34.9) 01/31/2023   Encounter for birth control pills maintenance 05/10/2021   Acne vulgaris 06/30/2018   Insomnia 11/08/2017   Anxiety 01/09/2015   Family disruption due to divorce 04/20/2013    Past Surgical History:  Procedure Laterality Date   EYE SURGERY      OB History   No obstetric history on file.      Home Medications    Prior to Admission medications   Medication Sig Start Date End Date Taking? Authorizing Provider  ketorolac (TORADOL) 10 MG tablet Take 1 tablet (10 mg total) by mouth every 6 (six) hours as needed (pain). 10/13/23  Yes Zenia Resides, MD    Family History Family History  Problem Relation Age of Onset   Arthritis Mother    High blood pressure Father     Social History Social History   Tobacco Use   Smoking status: Never   Smokeless tobacco: Never  Vaping Use   Vaping status: Never Used  Substance Use Topics   Alcohol use: Yes   Drug use: Not Currently    Types: MDMA (Ecstacy)     Allergies   Patient has no known allergies.   Review of Systems Review of Systems   Physical Exam Triage Vital Signs ED Triage Vitals  Encounter Vitals Group      BP 10/13/23 1528 123/71     Systolic BP Percentile --      Diastolic BP Percentile --      Pulse Rate 10/13/23 1528 82     Resp 10/13/23 1528 18     Temp 10/13/23 1528 98 F (36.7 C)     Temp Source 10/13/23 1528 Oral     SpO2 10/13/23 1528 97 %     Weight --      Height --      Head Circumference --      Peak Flow --      Pain Score 10/13/23 1526 5     Pain Loc --      Pain Education --      Exclude from Growth Chart --    No data found.  Updated Vital Signs BP 123/71 (BP Location: Left Arm)   Pulse 82   Temp 98 F (36.7 C) (Oral)   Resp 18   LMP 09/26/2023   SpO2 97%   Visual Acuity Right Eye Distance:   Left Eye Distance:   Bilateral Distance:    Right Eye Near:   Left Eye Near:    Bilateral Near:     Physical Exam Vitals reviewed.  Constitutional:  General: She is not in acute distress.    Appearance: She is not ill-appearing, toxic-appearing or diaphoretic.  Cardiovascular:     Pulses: Normal pulses.  Musculoskeletal:     Comments: There is tenderness and swelling over the lower 3 to 4 cm of the lateral malleolus.  Also anterior and inferior to that there is ecchymosis and swelling and tenderness.  There is tenderness of the lateral border of the foot .  Skin:    Coloration: Skin is not pale.  Neurological:     General: No focal deficit present.     Mental Status: She is alert and oriented to person, place, and time.  Psychiatric:        Behavior: Behavior normal.      UC Treatments / Results  Labs (all labs ordered are listed, but only abnormal results are displayed) Labs Reviewed - No data to display  EKG   Radiology No results found.  Procedures Procedures (including critical care time)  Medications Ordered in UC Medications  ketorolac (TORADOL) 30 MG/ML injection 30 mg (has no administration in time range)    Initial Impression / Assessment and Plan / UC Course  I have reviewed the triage vital signs and the nursing  notes.  Pertinent labs & imaging results that were available during my care of the patient were reviewed by me and considered in my medical decision making (see chart for details).   Cathlean Sauer of the left ankle shows a little avulsion fracture at the distal end of the fibula.  Toradol injections given here and Toradol tablets are sent to the pharmacy.  Boot and crutches are supplied.  She is given contact duration for podiatry/orthopedics Final Clinical Impressions(s) / UC Diagnoses   Final diagnoses:  Closed nondisplaced fracture of lateral malleolus of left fibula, initial encounter     Discharge Instructions      There is a small fracture at the tip of your fibula bone in the left ankle.  It is where the ligament pulled off a little chip of bone.  You have been given a shot of Toradol 30 mg today.  Ketorolac 10 mg tablets--take 1 tablet every 6 hours as needed for pain.  This is the same medicine that is in the shot we just gave you       ED Prescriptions     Medication Sig Dispense Auth. Provider   ketorolac (TORADOL) 10 MG tablet Take 1 tablet (10 mg total) by mouth every 6 (six) hours as needed (pain). 20 tablet Zohar Maroney, Janace Aris, MD      PDMP not reviewed this encounter.   Zenia Resides, MD 10/13/23 [phone]

## 2023-10-13 NOTE — ED Notes (Signed)
Visible swelling and reports of pain , particularly with standing

## 2023-10-15 ENCOUNTER — Ambulatory Visit (INDEPENDENT_AMBULATORY_CARE_PROVIDER_SITE_OTHER): Payer: Self-pay | Admitting: Podiatry

## 2023-10-15 ENCOUNTER — Encounter: Payer: Self-pay | Admitting: Podiatry

## 2023-10-15 VITALS — Ht <= 58 in | Wt 159.0 lb

## 2023-10-15 DIAGNOSIS — S82892D Other fracture of left lower leg, subsequent encounter for closed fracture with routine healing: Secondary | ICD-10-CM

## 2023-10-15 NOTE — Progress Notes (Signed)
Subjective:   Patient ID: Kathy Palmer, female   DOB: 22 y.o.   MRN: [id number]   HPI Patient presents on referral with a possible fracture left fibula and is wearing a boot and is nonweightbearing crutches   ROS      Objective:  Physical Exam  Neurovascular status intact muscle drink adequate range of motion adequate negative Homans' sign noted left fibula does have a possible small distal fracture but no indication of diastases injury other pathology     Assessment:  Probability for low-grade stable fracture left fibula     Plan:  H&P reviewed at great length continue nonweightbearing and then gradual weightbearing over the next few weeks and hopefully out of the boot within 4 weeks applied compression instructed on rest ice compression elevation and reappoint as symptoms indicate

## 2024-05-03 ENCOUNTER — Ambulatory Visit: Payer: Self-pay | Attending: Internal Medicine | Admitting: Internal Medicine

## 2024-05-03 VITALS — BP 107/68 | HR 88 | Temp 97.6°F | Ht <= 58 in | Wt 157.0 lb

## 2024-05-03 DIAGNOSIS — Z0000 Encounter for general adult medical examination without abnormal findings: Secondary | ICD-10-CM

## 2024-05-03 DIAGNOSIS — L0292 Furuncle, unspecified: Secondary | ICD-10-CM

## 2024-05-03 DIAGNOSIS — Z1159 Encounter for screening for other viral diseases: Secondary | ICD-10-CM

## 2024-05-03 DIAGNOSIS — Z6832 Body mass index (BMI) 32.0-32.9, adult: Secondary | ICD-10-CM

## 2024-05-03 DIAGNOSIS — E66811 Obesity, class 1: Secondary | ICD-10-CM

## 2024-05-03 DIAGNOSIS — E6609 Other obesity due to excess calories: Secondary | ICD-10-CM

## 2024-05-03 DIAGNOSIS — Z23 Encounter for immunization: Secondary | ICD-10-CM

## 2024-05-03 MED ORDER — MUPIROCIN CALCIUM 2 % EX CREA: 1.0000 | TOPICAL_CREAM | Freq: Two times a day (BID) | CUTANEOUS | 0 refills | Status: AC

## 2024-05-03 NOTE — Patient Instructions (Signed)
 VISIT SUMMARY:  Today, you came in for your annual physical exam. Your weight is stable, and you have a consistent exercise routine. We discussed your diet, exercise habits, and a few health concerns, including recurrent boils and general health maintenance.  YOUR PLAN:  -RECURRENT BOILS: Recurrent boils are likely due to wearing tight, non-breathable underwear. To manage this, you should use Bactroban ointment when boils appear and switch to breathable, cotton underwear.  -GENERAL HEALTH MAINTENANCE: Your BMI is 32, which is classified as obesity. You have a good exercise routine but need to improve your diet by including more fruits and vegetables. You are due for a Pap smear, Tdap vaccine, and hepatitis C screening. We will schedule the Pap smear for August, administer the Tdap vaccine, and order the hepatitis C screening. Additionally, you should consume at least three fresh fruits and fresh vegetables with at least one meal daily, choose lean meats like chicken, [place], and seafood, avoid sugary drinks, and opt for healthier snacks like fruits and nuts. Continue wearing your glasses for distance as prescribed and have an eye exam at least once every two years.  INSTRUCTIONS:  Please schedule your Pap smear for August. We will administer the Tdap vaccine today and order the hepatitis C screening. Continue with your current exercise routine and make the recommended dietary changes. Remember to use Bactroban ointment for boils and switch to breathable, cotton underwear. Schedule an eye exam if you haven't had one in the last two years.  Preventive Care 67-71 Years Old, Female Preventive care refers to lifestyle choices and visits with your health care provider that can promote health and wellness. Preventive care visits are also called wellness exams. What can I expect for my preventive care visit? Counseling During your preventive care visit, your health care provider may ask about  your: Medical history, including: Past medical problems. Family medical history. Pregnancy history. Current health, including: Menstrual cycle. Method of birth control. Emotional well-being. Home life and relationship well-being. Sexual activity and sexual health. Lifestyle, including: Alcohol, nicotine or tobacco, and drug use. Access to firearms. Diet, exercise, and sleep habits. Work and work Astronomer. Sunscreen use. Safety issues such as seatbelt and bike helmet use. Physical exam Your health care provider may check your: Height and weight. These may be used to calculate your BMI (body mass index). BMI is a measurement that tells if you are at a healthy weight. Waist circumference. This measures the distance around your waistline. This measurement also tells if you are at a healthy weight and may help predict your risk of certain diseases, such as type 2 diabetes and high blood pressure. Heart rate and blood pressure. Body temperature. Skin for abnormal spots. What immunizations do I need?  Vaccines are usually given at various ages, according to a schedule. Your health care provider will recommend vaccines for you based on your age, medical history, and lifestyle or other factors, such as travel or where you work. What tests do I need? Screening Your health care provider may recommend screening tests for certain conditions. This may include: Pelvic exam and Pap test. Lipid and cholesterol levels. Diabetes screening. This is done by checking your blood sugar (glucose) after you have not eaten for a while (fasting). Hepatitis B test. Hepatitis C test. HIV (human immunodeficiency virus) test. STI (sexually transmitted infection) testing, if you are at risk. BRCA-related cancer screening. This may be done if you have a family history of breast, ovarian, tubal, or peritoneal cancers. Talk with your health  care provider about your test results, treatment options, and if  necessary, the need for more tests. Follow these instructions at home: Eating and drinking  Eat a healthy diet that includes fresh fruits and vegetables, whole grains, lean protein, and low-fat dairy products. Take vitamin and mineral supplements as recommended by your health care provider. Do not drink alcohol if: Your health care provider tells you not to drink. You are pregnant, may be pregnant, or are planning to become pregnant. If you drink alcohol: Limit how much you have to 0-1 drink a day. Know how much alcohol is in your drink. In the [place] one drink equals one 12 oz bottle of beer (355 mL), one 5 oz glass of wine (148 mL), or one 1 oz glass of hard liquor (44 mL). Lifestyle Brush your teeth every morning and night with fluoride  toothpaste. Floss one time each day. Exercise for at least 30 minutes 5 or more days each week. Do not use any products that contain nicotine or tobacco. These products include cigarettes, chewing tobacco, and vaping devices, such as e-cigarettes. If you need help quitting, ask your health care provider. Do not use drugs. If you are sexually active, practice safe sex. Use a condom or other form of protection to prevent STIs. If you do not wish to become pregnant, use a form of birth control. If you plan to become pregnant, see your health care provider for a prepregnancy visit. Find healthy ways to manage stress, such as: Meditation, yoga, or listening to music. Journaling. Talking to a trusted person. Spending time with friends and family. Minimize exposure to UV radiation to reduce your risk of skin cancer. Safety Always wear your seat belt while driving or riding in a vehicle. Do not drive: If you have been drinking alcohol. Do not ride with someone who has been drinking. If you have been using any mind-altering substances or drugs. While texting. When you are tired or distracted. Wear a helmet and other protective equipment during sports  activities. If you have firearms in your house, make sure you follow all gun safety procedures. Seek help if you have been physically or sexually abused. What's next? Go to your health care provider once a year for an annual wellness visit. Ask your health care provider how often you should have your eyes and teeth checked. Stay up to date on all vaccines. This information is not intended to replace advice given to you by your health care provider. Make sure you discuss any questions you have with your health care provider. Document Revised: 05/02/2021 Document Reviewed: 05/02/2021 Elsevier Patient Education  2024 [employer].

## 2024-05-03 NOTE — Progress Notes (Signed)
 Patient ID: Kathy Palmer, female    DOB: 2001/09/14  MRN: [id number]  CC: Annual Exam (Physical. /Inflamed bikini line, boils /Discuss healthy eating habits /Tdap given 12/04/23 - C.A.)   Subjective: Kathy Palmer is a 23 y.o. female who presents for annual exam Her concerns today include:   Discussed the use of AI scribe software for clinical note transcription with the patient, who gave verbal consent to proceed.  History of Present Illness [name] is a 23 year old female who presents for an annual physical exam.  Her weight is stable at 157 pounds with a BMI of 32. She consumes two meals daily, often skipping breakfast, and snacks on chips at night. Her fluid intake consists mainly of water, avoiding sugary drinks. Her diet includes chicken, beef, fruit cups, and vegetables when incorporated into meals by her mother.  She exercises five days a week, combining weight lifting and cardio for about an hour and twenty minutes per session, with at least 30 minutes dedicated to cardio.  She experiences boils around the bikini line when wearing regular bikini underwear, with one currently resolving. She prefers boy shorts for comfort which are more comfort feeding.  She does not get boils when she wears the boys shorts for underwear.  She fractured her left ankle last year but has no current joint pain. She uses glasses for distance at night while driving. No issues with shortness of breath, chronic cough, chest pains, bowel movements, or blood in stools. She maintains good oral hygiene and sees a dentist biannually.    Patient Active Problem List   Diagnosis Date Noted   Obesity (BMI 30.0-34.9) 01/31/2023   Encounter for birth control pills maintenance 05/10/2021   Acne vulgaris 06/30/2018   Insomnia 11/08/2017   Anxiety 01/09/2015   Family disruption due to divorce 04/20/2013     Current Outpatient Medications on File Prior to Visit  Medication Sig  Dispense Refill   ketorolac  (TORADOL ) 10 MG tablet Take 1 tablet (10 mg total) by mouth every 6 (six) hours as needed (pain). 20 tablet 0   No current facility-administered medications on file prior to visit.    No Known Allergies  Social History   Socioeconomic History   Marital status: Single    Spouse name: Not on file   Number of children: Not on file   Years of education: Not on file   Highest education level: 12th grade  Occupational History   Not on file  Tobacco Use   Smoking status: Never   Smokeless tobacco: Never  Vaping Use   Vaping status: Never Used  Substance and Sexual Activity   Alcohol use: Yes   Drug use: Not Currently    Types: MDMA (Ecstacy)   Sexual activity: Never  Other Topics Concern   Not on file  Social History Narrative   Had a very difficult year because parents are divorced.  Maternal GF also passed away this past year.     Social Drivers of Corporate investment banker Strain: Low Risk  (05/03/2024)   Overall Financial Resource Strain (CARDIA)    Difficulty of Paying Living Expenses: Not hard at all  Food Insecurity: No Food Insecurity (05/03/2024)   Hunger Vital Sign    Worried About Running Out of Food in the Last Year: Never true    Ran Out of Food in the Last Year: Never true  Transportation Needs: No Transportation Needs (05/03/2024)   PRAPARE - Transportation  Lack of Transportation (Medical): No    Lack of Transportation (Non-Medical): No  Physical Activity: Sufficiently Active (05/03/2024)   Exercise Vital Sign    Days of Exercise per Week: 5 days    Minutes of Exercise per Session: 80 min  Stress: No Stress Concern Present (05/03/2024)   [employer] of Occupational Health - Occupational Stress Questionnaire    Feeling of Stress: Not at all  Social Connections: Unknown (05/03/2024)   Social Connection and Isolation Panel    Frequency of Communication with Friends and Family: More than three times a week    Frequency of  Social Gatherings with Friends and Family: Twice a week    Attends Religious Services: 1 to 4 times per year    Active Member of [employer] or Organizations: No    Attends Engineer, structural: Not on file    Marital Status: Patient declined  Catering manager Violence: Not on file    Family History  Problem Relation Age of Onset   Arthritis Mother    High blood pressure Father     Past Surgical History:  Procedure Laterality Date   EYE SURGERY      ROS: Review of Systems  HENT:  Negative for congestion, sore throat and trouble swallowing.   Respiratory:  Negative for cough and shortness of breath.   Cardiovascular:  Negative for chest pain.  Psychiatric/Behavioral:  Negative for dysphoric mood.    Negative except as stated above  PHYSICAL EXAM: BP 107/68 (BP Location: Left Arm, Patient Position: Sitting, Cuff Size: Normal)   Pulse 88   Temp 97.6 F (36.4 C) (Oral)   Ht 4' 10 (1.473 m)   Wt 157 lb (71.2 kg)   SpO2 98%   BMI 32.81 kg/m   Wt Readings from Last 3 Encounters:  05/03/24 157 lb (71.2 kg)  10/15/23 159 lb (72.1 kg)  06/20/23 159 lb (72.1 kg)   Physical Exam  General appearance - alert, well appearing, and in no distress Mental status - normal mood, behavior, speech, dress, motor activity, and thought processes Eyes - pupils equal and reactive, extraocular eye movements intact Ears - bilateral TM's and external ear canals normal Nose - normal and patent, no erythema, discharge or polyps Mouth - mucous membranes moist, pharynx normal without lesions Neck - supple, no significant adenopathy Lymphatics - no palpable lymphadenopathy, no hepatosplenomegaly Chest - clear to auscultation, no wheezes, rales or rhonchi, symmetric air entry Heart - normal rate, regular rhythm, normal S1, S2, no murmurs, rubs, clicks or gallops Abdomen - soft, nontender, nondistended, no masses or organomegaly Neurological - cranial nerves II through XII intact, motor and  sensory grossly normal bilaterally Musculoskeletal - no joint tenderness, deformity or swelling Extremities - peripheral pulses normal, no pedal edema, no clubbing or cyanosis Skin -no boils or scarring noted at the bikini line/inguinal area at this time.      Latest Ref Rng & Units 09/12/2015    4:13 PM  CMP  Glucose 65 - 99 mg/dL 77   BUN 7 - 20 mg/dL 14   Creatinine 1.61 - 1.00 mg/dL 0.96   Sodium 045 - 409 mmol/L 139   Potassium 3.8 - 5.1 mmol/L 4.3   Chloride 98 - 110 mmol/L 104   CO2 20 - 31 mmol/L 26   Calcium 8.9 - 10.4 mg/dL 9.7   Total Protein 6.3 - 8.2 g/dL 7.8   Total Bilirubin 0.2 - 1.1 mg/dL 0.7   Alkaline Phos 41 - 244 U/L  63   AST 12 - 32 U/L 17   ALT 6 - 19 U/L 11    Lipid Panel     Component Value Date/Time   CHOL 157 04/03/2021 1445   TRIG 65 04/03/2021 1445   HDL 46 (L) 04/03/2021 1445   CHOLHDL 3.4 04/03/2021 1445   VLDL 28 09/12/2015 1613   LDLCALC 96 04/03/2021 1445    CBC    Component Value Date/Time   WBC 8.2 09/12/2015 1613   RBC 4.53 09/12/2015 1613   HGB 12.9 09/12/2015 1613   HCT 38.3 09/12/2015 1613   PLT 339 09/12/2015 1613   MCV 84.5 09/12/2015 1613   MCH 28.5 09/12/2015 1613   MCHC 33.7 09/12/2015 1613   RDW 13.8 09/12/2015 1613   LYMPHSABS 3.0 09/12/2015 1613   MONOABS 0.5 09/12/2015 1613   EOSABS 0.2 09/12/2015 1613   BASOSABS 0.0 09/12/2015 1613    ASSESSMENT AND PLAN: 1. Annual physical exam (Primary) Patient is not sure whether she has had meningococcal vaccine.  We do not offer it here.  Advised that she can get this done through the health department. Encouraged her to wear her glasses consistently during the day since it is for distance. Patient will be due for repeat Pap smear in August.  I offered to do it while she is here today but patient prefers to wait until August to have that done.  2. Class 1 obesity due to excess calories without serious comorbidity with body mass index (BMI) of 32.0 to 32.9 in  adult Patient advised to eliminate sugary drinks from the diet, cut back on portion sizes especially of white carbohydrates, eat more white lean meat like chicken [place] and seafood instead of beef or pork and incorporate fresh fruits and vegetables into the diet daily. Continue regular exercise  3. Recurrent boils Patient reports recurrent boils along the bikini line when she wears tight fitting underwear.  Advised to wear comfortable underwear made of breathable material like cotton.  I have given her some Bactroban cream to keep on hand and use when needed - mupirocin cream (BACTROBAN) 2 %; Apply 1 Application topically 2 (two) times daily.  Dispense: 15 g; Refill: 0  4. Need for Tdap vaccination Given today.  5. Need for hepatitis C screening test - Hepatitis C Antibody     Patient was given the opportunity to ask questions.  Patient verbalized understanding of the plan and was able to repeat key elements of the plan.   This documentation was completed using Paediatric nurse.  Any transcriptional errors are unintentional.  Orders Placed This Encounter  Procedures   Hepatitis C Antibody     Requested Prescriptions   Signed Prescriptions Disp Refills   mupirocin cream (BACTROBAN) 2 % 15 g 0    Sig: Apply 1 Application topically 2 (two) times daily.    Return in about 2 months (around 07/03/2024) for PAP.  Concetta Dee, MD, FACP

## 2024-05-04 ENCOUNTER — Ambulatory Visit: Payer: Self-pay | Admitting: Internal Medicine

## 2024-05-04 LAB — HEPATITIS C ANTIBODY: Hep C Virus Ab: NONREACTIVE

## 2024-07-05 ENCOUNTER — Ambulatory Visit: Payer: Self-pay | Admitting: Internal Medicine

## 2024-10-28 ENCOUNTER — Encounter (HOSPITAL_COMMUNITY): Payer: Self-pay

## 2024-10-28 ENCOUNTER — Ambulatory Visit (HOSPITAL_COMMUNITY)
Admission: RE | Admit: 2024-10-28 | Discharge: 2024-10-28 | Disposition: A | Discharge status: Home / Self Care | Payer: Self-pay | Source: Ambulatory Visit | Attending: Family Medicine | Admitting: Family Medicine

## 2024-10-28 VITALS — BP 119/75 | HR 83 | Temp 98.3°F | Resp 16

## 2024-10-28 DIAGNOSIS — H6691 Otitis media, unspecified, right ear: Secondary | ICD-10-CM

## 2024-10-28 MED ORDER — IBUPROFEN 600 MG PO TABS: 600.0000 mg | ORAL_TABLET | Freq: Three times a day (TID) | ORAL | 0 refills | Status: AC | PRN

## 2024-10-28 NOTE — Discharge Instructions (Signed)
 Take amoxicillin  500 mg--1 tab 3 times daily for 7 days  Take ibuprofen 600 mg--1 tab every 8 hours as needed for pain.

## 2024-10-28 NOTE — ED Provider Notes (Signed)
 [facility]    CSN: [id number] Arrival date & time: 10/28/24  1533      History   Chief Complaint No chief complaint on file.   HPI Kathy Palmer is a 23 y.o. female.   HPI  Here for nasal congestion and drainage and cough.  Symptoms began on December 8.  Then yesterday she started having right ear pain and pressure.  She has taken some Mucinex but not anything for pain.  No fever or chills and no nausea or vomiting or diarrhea.  No chest pain and no shortness of breath.  NKDA  Last menstrual cycle was December 4  She does not have any chronic conditions including no asthma. Past Medical History:  Diagnosis Date   Anxiety    Deliberate self-cutting    Deliberate self-cutting 04/20/2013   Panic attack 01/09/2015    Patient Active Problem List   Diagnosis Date Noted   Obesity (BMI 30.0-34.9) 01/31/2023   Encounter for birth control pills maintenance 05/10/2021   Acne vulgaris 06/30/2018   Insomnia 11/08/2017   Anxiety 01/09/2015   Family disruption due to divorce 04/20/2013    Past Surgical History:  Procedure Laterality Date   EYE SURGERY      OB History   No obstetric history on file.      Home Medications    Prior to Admission medications  Medication Sig Start Date End Date Taking? Authorizing Provider  amoxicillin  (AMOXIL ) 500 MG tablet Take 1 tablet (500 mg total) by mouth in the morning, at noon, and at bedtime for 7 days. 10/28/24 11/04/24 Yes Ferd Horrigan K, MD  ibuprofen (ADVIL) 600 MG tablet Take 1 tablet (600 mg total) by mouth every 8 (eight) hours as needed (pain). 10/28/24  Yes Vonna Sharlet POUR, MD  mupirocin  cream (BACTROBAN ) 2 % Apply 1 Application topically 2 (two) times daily. 05/03/24   Vicci Barnie NOVAK, MD    Family History Family History  Problem Relation Age of Onset   Arthritis Mother    High blood pressure Father     Social History Social History[1]   Allergies   Patient has no known  allergies.   Review of Systems Review of Systems   Physical Exam Triage Vital Signs ED Triage Vitals  Encounter Vitals Group     BP 10/28/24 1608 119/75     Girls Systolic BP Percentile --      Girls Diastolic BP Percentile --      Boys Systolic BP Percentile --      Boys Diastolic BP Percentile --      Pulse Rate 10/28/24 1608 83     Resp 10/28/24 1608 16     Temp 10/28/24 1608 98.3 F (36.8 C)     Temp Source 10/28/24 1608 Oral     SpO2 10/28/24 1609 98 %     Weight --      Height --      Head Circumference --      Peak Flow --      Pain Score 10/28/24 1607 0     Pain Loc --      Pain Education --      Exclude from Growth Chart --    No data found.  Updated Vital Signs BP 119/75 (BP Location: Right Arm)   Pulse 83   Temp 98.3 F (36.8 C) (Oral)   Resp 16   LMP 10/21/2024 (Exact Date)   SpO2 98%   Visual Acuity Right Eye Distance:  Left Eye Distance:   Bilateral Distance:    Right Eye Near:   Left Eye Near:    Bilateral Near:     Physical Exam Vitals reviewed.  Constitutional:      General: She is not in acute distress.    Appearance: She is not toxic-appearing.  HENT:     Right Ear: Ear canal normal.     Left Ear: Tympanic membrane and ear canal normal.     Ears:     Comments: Her right TM is red and dull on the inferior aspect. Left TM is gray and shiny    Nose: Congestion present.     Mouth/Throat:     Mouth: Mucous membranes are moist.     Comments: There is some very mild erythema of the tonsillar pillars, but no tonsillar hypertrophy or exudate or erythema.  There is clear exudate draining in the posterior oropharynx. Eyes:     Extraocular Movements: Extraocular movements intact.     Conjunctiva/sclera: Conjunctivae normal.     Pupils: Pupils are equal, round, and reactive to light.  Cardiovascular:     Rate and Rhythm: Normal rate and regular rhythm.     Heart sounds: No murmur heard. Pulmonary:     Effort: Pulmonary effort is normal.  No respiratory distress.     Breath sounds: No stridor. No wheezing, rhonchi or rales.  Musculoskeletal:     Cervical back: Neck supple.  Lymphadenopathy:     Cervical: No cervical adenopathy.  Skin:    Capillary Refill: Capillary refill takes less than 2 seconds.     Coloration: Skin is not jaundiced or pale.  Neurological:     General: No focal deficit present.     Mental Status: She is alert and oriented to person, place, and time.  Psychiatric:        Behavior: Behavior normal.      UC Treatments / Results  Labs (all labs ordered are listed, but only abnormal results are displayed) Labs Reviewed - No data to display  EKG   Radiology No results found.  Procedures Procedures (including critical care time)  Medications Ordered in UC Medications - No data to display  Initial Impression / Assessment and Plan / UC Course  I have reviewed the triage vital signs and the nursing notes.  Pertinent labs & imaging results that were available during my care of the patient were reviewed by me and considered in my medical decision making (see chart for details).     Amoxicillin  is sent in to treat the otitis media and some ibuprofen 600 mg is sent in to treat pain  Good Rx prescription coupons are printed for her Final Clinical Impressions(s) / UC Diagnoses   Final diagnoses:  Right otitis media, unspecified otitis media type     Discharge Instructions      Take amoxicillin  500 mg--1 tab 3 times daily for 7 days  Take ibuprofen 600 mg--1 tab every 8 hours as needed for pain.        ED Prescriptions     Medication Sig Dispense Auth. Provider   amoxicillin  (AMOXIL ) 500 MG tablet Take 1 tablet (500 mg total) by mouth in the morning, at noon, and at bedtime for 7 days. 21 tablet Bellina Tokarczyk K, MD   ibuprofen (ADVIL) 600 MG tablet Take 1 tablet (600 mg total) by mouth every 8 (eight) hours as needed (pain). 15 tablet Undray Allman K, MD      PDMP not  reviewed  this encounter.    [1]  Social History Tobacco Use   Smoking status: Never   Smokeless tobacco: Never  Vaping Use   Vaping status: Never Used  Substance Use Topics   Alcohol use: Yes   Drug use: Not Currently    Types: MDMA (Ecstacy)     Vonna Sharlet POUR, MD 10/28/24 1635

## 2024-10-28 NOTE — ED Triage Notes (Signed)
 Pt c/o cough and congestion since Monday and rt ear pressure since yesterday. Denies taken any meds.

## 2024-12-17 ENCOUNTER — Ambulatory Visit: Payer: Self-pay | Admitting: Internal Medicine

## 2025-05-05 ENCOUNTER — Ambulatory Visit: Payer: Self-pay | Admitting: Internal Medicine
